# Patient Record
Sex: Female | Born: 1935 | Race: White | Hispanic: No | Smoking: Never smoker
Health system: Southern US, Community
[De-identification: ages and names within clinical notes are randomized; demographics above are authoritative.]

## PROBLEM LIST (undated history)

## (undated) DIAGNOSIS — F32A Depression, unspecified: Secondary | ICD-10-CM

## (undated) DIAGNOSIS — C8590 Non-Hodgkin lymphoma, unspecified, unspecified site: Secondary | ICD-10-CM

## (undated) DIAGNOSIS — Z9889 Other specified postprocedural states: Secondary | ICD-10-CM

## (undated) DIAGNOSIS — F329 Major depressive disorder, single episode, unspecified: Secondary | ICD-10-CM

## (undated) DIAGNOSIS — R112 Nausea with vomiting, unspecified: Secondary | ICD-10-CM

## (undated) DIAGNOSIS — D649 Anemia, unspecified: Secondary | ICD-10-CM

## (undated) DIAGNOSIS — F419 Anxiety disorder, unspecified: Secondary | ICD-10-CM

## (undated) HISTORY — PX: CATARACT EXTRACTION, BILATERAL: SHX1313

---
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
## 1958-11-24 DIAGNOSIS — R112 Nausea with vomiting, unspecified: Secondary | ICD-10-CM

## 1958-11-24 HISTORY — PX: ABDOMINAL HYSTERECTOMY: SHX81

## 1958-11-24 HISTORY — DX: Nausea with vomiting, unspecified: R11.2

## 1960-11-24 HISTORY — PX: BILATERAL OOPHORECTOMY: SHX1221

## 1999-12-26 ENCOUNTER — Encounter: Admission: RE | Admit: 1999-12-26 | Discharge: 1999-12-26 | Payer: Self-pay | Admitting: Geriatric Medicine

## 1999-12-26 ENCOUNTER — Encounter: Payer: Self-pay | Admitting: Geriatric Medicine

## 1999-12-31 ENCOUNTER — Other Ambulatory Visit: Admission: RE | Admit: 1999-12-31 | Discharge: 1999-12-31 | Payer: Self-pay | Admitting: Otolaryngology

## 2000-01-09 ENCOUNTER — Other Ambulatory Visit: Admission: RE | Admit: 2000-01-09 | Discharge: 2000-01-09 | Payer: Self-pay | Admitting: Otolaryngology

## 2000-02-05 ENCOUNTER — Other Ambulatory Visit: Admission: RE | Admit: 2000-02-05 | Discharge: 2000-02-05 | Payer: Self-pay | Admitting: Otolaryngology

## 2000-06-18 ENCOUNTER — Ambulatory Visit (HOSPITAL_COMMUNITY): Admission: RE | Admit: 2000-06-18 | Discharge: 2000-06-18 | Payer: Self-pay | Admitting: Gastroenterology

## 2000-09-11 ENCOUNTER — Encounter: Payer: Self-pay | Admitting: Oncology

## 2000-09-11 ENCOUNTER — Ambulatory Visit (HOSPITAL_COMMUNITY): Admission: RE | Admit: 2000-09-11 | Discharge: 2000-09-11 | Payer: Self-pay | Admitting: Oncology

## 2001-03-19 ENCOUNTER — Encounter: Payer: Self-pay | Admitting: Oncology

## 2001-03-19 ENCOUNTER — Ambulatory Visit (HOSPITAL_COMMUNITY): Admission: RE | Admit: 2001-03-19 | Discharge: 2001-03-19 | Payer: Self-pay | Admitting: Oncology

## 2002-04-20 ENCOUNTER — Encounter: Admission: RE | Admit: 2002-04-20 | Discharge: 2002-04-20 | Payer: Self-pay | Admitting: Geriatric Medicine

## 2002-04-20 ENCOUNTER — Encounter: Payer: Self-pay | Admitting: Geriatric Medicine

## 2002-04-26 ENCOUNTER — Ambulatory Visit (HOSPITAL_COMMUNITY): Admission: RE | Admit: 2002-04-26 | Discharge: 2002-04-26 | Payer: Self-pay | Admitting: Geriatric Medicine

## 2002-04-26 ENCOUNTER — Encounter: Payer: Self-pay | Admitting: Geriatric Medicine

## 2002-10-03 ENCOUNTER — Encounter: Payer: Self-pay | Admitting: Oncology

## 2002-10-03 ENCOUNTER — Ambulatory Visit (HOSPITAL_COMMUNITY): Admission: RE | Admit: 2002-10-03 | Discharge: 2002-10-03 | Payer: Self-pay | Admitting: Oncology

## 2003-04-25 ENCOUNTER — Encounter: Admission: RE | Admit: 2003-04-25 | Discharge: 2003-04-25 | Payer: Self-pay | Admitting: Geriatric Medicine

## 2003-04-25 ENCOUNTER — Encounter: Payer: Self-pay | Admitting: Geriatric Medicine

## 2003-10-02 ENCOUNTER — Ambulatory Visit (HOSPITAL_COMMUNITY): Admission: RE | Admit: 2003-10-02 | Discharge: 2003-10-02 | Payer: Self-pay | Admitting: Oncology

## 2004-04-25 ENCOUNTER — Encounter: Admission: RE | Admit: 2004-04-25 | Discharge: 2004-04-25 | Payer: Self-pay | Admitting: Geriatric Medicine

## 2004-05-15 ENCOUNTER — Encounter: Admission: RE | Admit: 2004-05-15 | Discharge: 2004-05-15 | Payer: Self-pay | Admitting: Internal Medicine

## 2004-08-15 ENCOUNTER — Ambulatory Visit (HOSPITAL_COMMUNITY): Admission: RE | Admit: 2004-08-15 | Discharge: 2004-08-15 | Payer: Self-pay | Admitting: Gastroenterology

## 2005-02-04 ENCOUNTER — Ambulatory Visit: Payer: Self-pay | Admitting: Oncology

## 2005-04-29 ENCOUNTER — Encounter: Admission: RE | Admit: 2005-04-29 | Discharge: 2005-04-29 | Payer: Self-pay | Admitting: Geriatric Medicine

## 2005-05-29 ENCOUNTER — Ambulatory Visit: Payer: Self-pay | Admitting: Oncology

## 2006-05-11 ENCOUNTER — Encounter: Admission: RE | Admit: 2006-05-11 | Discharge: 2006-05-11 | Payer: Self-pay | Admitting: Geriatric Medicine

## 2007-05-20 ENCOUNTER — Encounter: Admission: RE | Admit: 2007-05-20 | Discharge: 2007-05-20 | Payer: Self-pay | Admitting: Geriatric Medicine

## 2007-05-26 ENCOUNTER — Encounter: Admission: RE | Admit: 2007-05-26 | Discharge: 2007-05-26 | Payer: Self-pay | Admitting: Geriatric Medicine

## 2008-09-27 ENCOUNTER — Encounter: Admission: RE | Admit: 2008-09-27 | Discharge: 2008-09-27 | Payer: Self-pay | Admitting: Geriatric Medicine

## 2009-10-11 ENCOUNTER — Encounter: Admission: RE | Admit: 2009-10-11 | Discharge: 2009-10-11 | Payer: Self-pay | Admitting: Geriatric Medicine

## 2010-12-15 ENCOUNTER — Encounter: Payer: Self-pay | Admitting: Geriatric Medicine

## 2011-04-11 NOTE — Op Note (Signed)
[name], [name] NO.:  [id number]   MEDICAL RECORD NO.:  [id number]          PATIENT TYPE:  AMB   LOCATION:  ENDO                         FACILITY:  [facility]   PHYSICIAN:  Beth Ramos, M.D.   DATE OF BIRTH:  1923/03/15   DATE OF PROCEDURE:  08/15/2004  DATE OF DISCHARGE:                                 OPERATIVE REPORT   PROCEDURE:  Esophagogastroduodenoscopy with Savary esophageal dilation.   INDICATIONS FOR PROCEDURE:  [name] is a 77 year old female born  Dec 15, 1922.  [name] underwent a barium esophogram to evaluate  dysphagia.  Her barium esophogram reveals narrowing at the esophagogastric  junction.  A 13 mm barium tablet traversed the entire length of the  esophagus without hang-up.   ENDOSCOPIST:  Beth Ramos, M.D.   PREMEDICATION:  Versed 2.5 mg, Demerol 50 mg.   PROCEDURE:  After obtaining informed consent, Beth Ramos was placed in the  left lateral decubitus position.  I administered intravenous Demerol and  intravenous Versed to achieve conscious sedation for the procedure.  Patient's blood pressure, oxygen saturation, and cardiac rhythm were  monitored throughout the procedure and documented in the medical record.   The Olympus gastroscope was passed through the posterior hypopharynx and  into the proximal esophagus without difficulty.  The hypopharynx, larynx,  and vocal cords appeared normal.   ESOPHAGOSCOPY:  The proximal, mid, and lower segments of the esophageal  mucosa appear normal.  The esophagogastric junction is noted at 30 cm from  the incisor teeth.  There is a benign-appearing peptic stricture at the  esophagogastric junction.  There is no endoscopic evidence for the presence  of erosive esophagitis or Barrett's esophagus.   GASTROSCOPY:  Beth Ramos has as moderate-sized hiatal hernia.  Retroflexed  view of the gastric cardia and fundus was normal.  The diaphragmatic hiatus  was very patulous.  The gastric body,  antrum, and pylorus appeared normal.   DUODENOSCOPY:  The duodenal bulb and descending duodenum appeared normal.   SAVARY ESOPHAGEAL DILATION:  The Savary wire was passed through the  gastroscope and the tip of the guidewire advanced to the distal gastric  antrum.  This confirmed endoscopically without the use of fluoroscopy.  The  16 mm Savary dilator passed without resistance.  Repeat esophagogastroscopy  confirmed satisfactory dilation of the benign peptic stricture at the  esophagogastric junction and no gastric trauma due to the guidewire.   ASSESSMENT:  Probably chronic gastroesophageal reflux associated with a  benign peptic stricture at the esophagogastric junction, dilated with the 16  mm Savary dilator.      MJ/MEDQ  D:  08/15/2004  T:  08/15/2004  Job:  [id number]   cc:   Hal T. Stoneking, M.D.  [address] [place], [place] [address]  Fax: [phone]

## 2011-11-03 ENCOUNTER — Inpatient Hospital Stay (HOSPITAL_COMMUNITY)
Admission: EM | Admit: 2011-11-03 | Discharge: 2011-11-08 | DRG: 812 | Disposition: A | Discharge status: Home / Self Care | Payer: Medicare Other | Attending: Family Medicine | Admitting: Family Medicine

## 2011-11-03 ENCOUNTER — Emergency Department (HOSPITAL_COMMUNITY): Payer: Medicare Other

## 2011-11-03 ENCOUNTER — Encounter: Payer: Self-pay | Admitting: Emergency Medicine

## 2011-11-03 ENCOUNTER — Other Ambulatory Visit: Payer: Self-pay

## 2011-11-03 DIAGNOSIS — K449 Diaphragmatic hernia without obstruction or gangrene: Secondary | ICD-10-CM | POA: Diagnosis present

## 2011-11-03 DIAGNOSIS — R197 Diarrhea, unspecified: Secondary | ICD-10-CM | POA: Diagnosis present

## 2011-11-03 DIAGNOSIS — F3289 Other specified depressive episodes: Secondary | ICD-10-CM | POA: Diagnosis present

## 2011-11-03 DIAGNOSIS — K269 Duodenal ulcer, unspecified as acute or chronic, without hemorrhage or perforation: Secondary | ICD-10-CM | POA: Diagnosis present

## 2011-11-03 DIAGNOSIS — R5381 Other malaise: Secondary | ICD-10-CM | POA: Diagnosis present

## 2011-11-03 DIAGNOSIS — I480 Paroxysmal atrial fibrillation: Secondary | ICD-10-CM

## 2011-11-03 DIAGNOSIS — C8589 Other specified types of non-Hodgkin lymphoma, extranodal and solid organ sites: Secondary | ICD-10-CM | POA: Diagnosis present

## 2011-11-03 DIAGNOSIS — K2970 Gastritis, unspecified, without bleeding: Secondary | ICD-10-CM | POA: Diagnosis present

## 2011-11-03 DIAGNOSIS — D500 Iron deficiency anemia secondary to blood loss (chronic): Principal | ICD-10-CM | POA: Diagnosis present

## 2011-11-03 DIAGNOSIS — E860 Dehydration: Secondary | ICD-10-CM | POA: Diagnosis present

## 2011-11-03 DIAGNOSIS — I471 Supraventricular tachycardia, unspecified: Secondary | ICD-10-CM | POA: Diagnosis not present

## 2011-11-03 DIAGNOSIS — R799 Abnormal finding of blood chemistry, unspecified: Secondary | ICD-10-CM | POA: Diagnosis present

## 2011-11-03 DIAGNOSIS — R55 Syncope and collapse: Secondary | ICD-10-CM

## 2011-11-03 DIAGNOSIS — E876 Hypokalemia: Secondary | ICD-10-CM | POA: Diagnosis present

## 2011-11-03 DIAGNOSIS — F329 Major depressive disorder, single episode, unspecified: Secondary | ICD-10-CM | POA: Diagnosis present

## 2011-11-03 DIAGNOSIS — R000 Tachycardia, unspecified: Secondary | ICD-10-CM

## 2011-11-03 DIAGNOSIS — R531 Weakness: Secondary | ICD-10-CM

## 2011-11-03 DIAGNOSIS — D72829 Elevated white blood cell count, unspecified: Secondary | ICD-10-CM | POA: Diagnosis present

## 2011-11-03 DIAGNOSIS — I959 Hypotension, unspecified: Secondary | ICD-10-CM | POA: Diagnosis present

## 2011-11-03 HISTORY — DX: Nausea with vomiting, unspecified: R11.2

## 2011-11-03 HISTORY — DX: Other specified postprocedural states: Z98.890

## 2011-11-03 HISTORY — DX: Anemia, unspecified: D64.9

## 2011-11-03 HISTORY — DX: Non-Hodgkin lymphoma, unspecified, unspecified site: C85.90

## 2011-11-03 HISTORY — DX: Major depressive disorder, single episode, unspecified: F32.9

## 2011-11-03 HISTORY — DX: Depression, unspecified: F32.A

## 2011-11-03 LAB — CARDIAC PANEL(CRET KIN+CKTOT+MB+TROPI)
CK, MB: 3.7 ng/mL (ref 0.3–4.0)
CK, MB: 3.1 ng/mL (ref 0.3–4.0)
Relative Index: INVALID (ref 0.0–2.5)
Relative Index: INVALID (ref 0.0–2.5)
Total CK: 26 U/L (ref 7–177)
Troponin I: <0.3 ng/mL (ref <0.30)

## 2011-11-03 LAB — URINALYSIS, ROUTINE W REFLEX MICROSCOPIC
Glucose, UA: NEGATIVE mg/dL
Hgb urine dipstick: NEGATIVE
Ketones, ur: NEGATIVE mg/dL
Leukocytes, UA: NEGATIVE
Nitrite: NEGATIVE
Specific Gravity, Urine: 1.024 (ref 1.005–1.030)
Urobilinogen, UA: 0.2 mg/dL (ref 0.0–1.0)
pH: 5 (ref 5.0–8.0)

## 2011-11-03 LAB — CBC
HCT: 25.2 % — ABNORMAL LOW (ref 36.0–46.0)
MCH: 33.3 pg (ref 26.0–34.0)
MCHC: 34.9 g/dL (ref 30.0–36.0)
MCV: 95.5 fL (ref 78.0–100.0)
Platelets: 198 10*3/uL (ref 150–400)
RDW: 12.5 % (ref 11.5–15.5)

## 2011-11-03 LAB — RETICULOCYTES: Retic Ct Pct: 2.7 % (ref 0.4–3.1)

## 2011-11-03 LAB — COMPREHENSIVE METABOLIC PANEL
ALT: 15 U/L (ref 0–35)
AST: 18 U/L (ref 0–37)
Alkaline Phosphatase: 62 U/L (ref 39–117)
BUN: 63 mg/dL — ABNORMAL HIGH (ref 6–23)
Sodium: 140 mEq/L (ref 135–145)
Total Bilirubin: 0.2 mg/dL — ABNORMAL LOW (ref 0.3–1.2)

## 2011-11-03 LAB — PHOSPHORUS: Phosphorus: 3 mg/dL (ref 2.3–4.6)

## 2011-11-03 LAB — APTT: aPTT: 32 seconds (ref 24–37)

## 2011-11-03 LAB — PROTIME-INR
INR: 1.07 (ref 0.00–1.49)
Prothrombin Time: 14.1 seconds (ref 11.6–15.2)

## 2011-11-03 MED ORDER — VITAMIN C 500 MG PO TABS
500.0000 mg | ORAL_TABLET | Freq: Every day | ORAL | Status: DC
  Administered 2011-11-03 – 2011-11-08 (×6): 500 mg via ORAL
  Filled 2011-11-03 (×7): qty 1

## 2011-11-03 MED ORDER — KCL IN DEXTROSE-NACL 10-5-0.45 MEQ/L-%-% IV SOLN
INTRAVENOUS | Status: DC
  Administered 2011-11-03 – 2011-11-05 (×4): via INTRAVENOUS
  Administered 2011-11-06: 1000 mL via INTRAVENOUS
  Filled 2011-11-03 (×9): qty 1000

## 2011-11-03 MED ORDER — DULOXETINE HCL 60 MG PO CPEP
60.0000 mg | ORAL_CAPSULE | Freq: Every day | ORAL | Status: DC
Start: 2011-11-04 — End: 2011-11-08
  Administered 2011-11-04 – 2011-11-08 (×4): 60 mg via ORAL
  Filled 2011-11-03 (×6): qty 1

## 2011-11-03 MED ORDER — ONDANSETRON HCL 4 MG/2ML IJ SOLN: 4.0000 mg | Freq: Three times a day (TID) | INTRAMUSCULAR | Status: AC | PRN

## 2011-11-03 MED ORDER — FERROUS SULFATE 325 (65 FE) MG PO TABS
325.0000 mg | ORAL_TABLET | Freq: Every day | ORAL | Status: DC
  Administered 2011-11-04 – 2011-11-08 (×5): 325 mg via ORAL
  Filled 2011-11-03 (×6): qty 1

## 2011-11-03 MED ORDER — TRAMADOL HCL 50 MG PO TABS
50.0000 mg | ORAL_TABLET | Freq: Two times a day (BID) | ORAL | Status: DC | PRN
  Administered 2011-11-03: 50 mg via ORAL
  Filled 2011-11-03: qty 1

## 2011-11-03 MED ORDER — VITAMIN B-12 1000 MCG PO TABS
1000.0000 ug | ORAL_TABLET | Freq: Every day | ORAL | Status: DC
  Administered 2011-11-03 – 2011-11-08 (×6): 1000 ug via ORAL
  Filled 2011-11-03 (×7): qty 1

## 2011-11-03 MED ORDER — GABAPENTIN 100 MG PO CAPS
100.0000 mg | ORAL_CAPSULE | Freq: Two times a day (BID) | ORAL | Status: DC
  Administered 2011-11-03 – 2011-11-08 (×10): 100 mg via ORAL
  Filled 2011-11-03 (×13): qty 1

## 2011-11-03 MED ORDER — ONDANSETRON HCL 4 MG/2ML IJ SOLN: 4.0000 mg | Freq: Four times a day (QID) | INTRAMUSCULAR | Status: DC | PRN

## 2011-11-03 MED ORDER — ONDANSETRON HCL 4 MG PO TABS: 4.0000 mg | ORAL_TABLET | Freq: Four times a day (QID) | ORAL | Status: DC | PRN

## 2011-11-03 MED ORDER — SODIUM CHLORIDE 0.9 % IV SOLN: Freq: Once | INTRAVENOUS | Status: DC

## 2011-11-03 MED ORDER — ACETAMINOPHEN 650 MG RE SUPP: 650.0000 mg | Freq: Once | RECTAL | Status: DC

## 2011-11-03 MED ORDER — ONDANSETRON HCL 4 MG/2ML IJ SOLN
INTRAMUSCULAR | Status: AC
  Filled 2011-11-03: qty 2

## 2011-11-03 MED ORDER — SODIUM CHLORIDE 0.9 % IV SOLN
999.0000 mL | Freq: Once | INTRAVENOUS | Status: AC
  Administered 2011-11-03: 500 mL via INTRAVENOUS

## 2011-11-03 NOTE — H&P (Signed)
Hospital Admission Note Date: 11/03/2011  PCP: Ginette Otto, MD, MD  Chief Complaint: Generalized weakness, Lightheaded.   History of Present Illness: Very pleasant 77 year old with Past  Medical History significant for  non-Hodgkin lymphoma in remission since 2005, Depression, Anemia Who was brought by EMS after she was found  Very weak lye down on her couch. The morning of admission at around 5 AM, she had some nausea. She went to the bathroom and she had 2 large watery bowel movement. After that she felt weak and fell in the floor. She crawled to the couch. She try to stand up but she was too weak and lightheaded. She has some mild back pain. She denies chest pain, palpitation, or shortness of breath at the time of episode. The day prior to admission she was at church and she had some lightheadness which resolved. Per Daughter Ms Jhaveri has not been eating well for last couple of weeks.  She denies cough, Dysuria, abdominal pain, melena or hemathochezia. No more Diarrhea while in the ED.    Allergies: Sulfa antibiotics Past Medical History  Diagnosis Date  . Depression   . PONV (postoperative nausea and vomiting) 1960  . Anemia   . Lymphoma    Prior to Admission medications   Medication Sig Start Date End Date Taking? Authorizing Provider  DULoxetine (CYMBALTA) 60 MG capsule Take 60 mg by mouth daily.     Yes Historical Provider, MD  ferrous sulfate 325 (65 FE) MG tablet Take 325 mg by mouth daily with breakfast.     Yes Historical Provider, MD  gabapentin (NEURONTIN) 100 MG capsule Take 100 mg by mouth 2 (two) times daily.     Yes Historical Provider, MD  traMADol (ULTRAM) 50 MG tablet Take 50 mg by mouth 2 (two) times daily as needed. Maximum dose= 8 tablets per day pain    Yes Historical Provider, MD  vitamin B-12 (CYANOCOBALAMIN) 1000 MCG tablet Take 1,000 mcg by mouth daily.     Yes Historical Provider, MD  vitamin C (ASCORBIC ACID) 500 MG tablet Take 500 mg by mouth daily.      Yes Historical Provider, MD   Past Surgical History  Procedure Date  . Abdominal hysterectomy 1960    partial  . Bilateral oophorectomy 1962  . Appendectomy 1948  . Cataract extraction, bilateral    History reviewed. No pertinent family history. History   Social History  . Marital Status: Widowed    Spouse Name: N/A    Number of Children: N/A  . Years of Education: N/A   Occupational History  . Not on file.   Social History Main Topics  . Smoking status: Never Smoker   . Smokeless tobacco: Never Used  . Alcohol Use: No  . Drug Use: No  . Sexually Active: No     Review of Systems: Pertinent items are noted in HPI. Physical Exam: Filed Vitals:   11/03/11 1117 11/03/11 1134 11/03/11 1346 11/03/11 1620  BP:  140/55 123/60 123/53  Pulse:  84 108 112  Temp:  97.7 F (36.5 C) 99.2 F (37.3 C)   TempSrc:   Oral   Resp:  20 20 18   SpO2: 99% 100% 99% 97%   No intake or output data in the 24 hours ending 11/03/11 1745 BP 123/53  Pulse 112  Temp(Src) 99.2 F (37.3 C) (Oral)  Resp 18  SpO2 97%  General Appearance:    Alert, cooperative, no distress, appears stated age  Head:  Normocephalic, without obvious abnormality, atraumatic  Eyes:    PERRL, conjunctiva/corneas clear, EOM's intact,     Ears:    Normal TM's and external ear canals, both ears  Nose:   Nares normal, septum midline, mucosa normal, no drainage    or sinus tenderness  Throat:   Lips, mucosa, and tongue dry;   Neck:   Supple, symmetrical, trachea midline, no adenopathy;    thyroid:  no enlargement/tenderness/nodules; no carotid   bruit or JVD     Lungs:     Clear to auscultation bilaterally, respirations unlabored  Chest Wall:    No tenderness or deformity   Heart:    Regular rate and rhythm, S1 and S2 normal, no murmur, rub   or gallop     Abdomen:     Soft, non-tender, bowel sounds active all four quadrants,    no masses, no organomegaly        Extremities:   Extremities normal,  atraumatic, no cyanosis or edema  Pulses:   2+ and symmetric all extremities  Skin:   Skin color, texture, turgor normal, no rashes or lesions     Neurologic:   CNII-XII intact, Patient with general weakness, sensation and reflexe throughout   Lab results:  [facility] 11/03/11 1219  NA 140  K 4.0  CL 104  CO2 25  GLUCOSE 109*  BUN 63*  CREATININE 0.57  CALCIUM 9.4  MG 1.7  PHOS --    Basename 11/03/11 1219  AST 18  ALT 15  ALKPHOS 62  BILITOT 0.2*  PROT 5.6*  ALBUMIN 3.3*   Basename 11/03/11 1219  WBC 17.2*  NEUTROABS --  HGB 8.8*  HCT 25.2*  MCV 95.5  PLT 198    Basename 11/03/11 1219  CKTOTAL 27  CKMB 3.7  CKMBINDEX --  TROPONINI <0.30   Imaging results:  Dg Chest 1 View  11/03/2011  *RADIOLOGY REPORT*  Clinical Data: Fall, weakness  CHEST - 1 VIEW  Comparison: None  Findings: Cardiomediastinal silhouette is unremarkable.  No acute infiltrate or pleural effusion.  No pulmonary edema.  Mild degenerative changes thoracic spine.  IMPRESSION: No active disease.  Mild degenerative changes thoracic spine.  Original Report Authenticated By: Natasha Mead, M.D.   Ct Head Wo Contrast  11/03/2011  *RADIOLOGY REPORT*  Clinical Data: Recent fall.  Vomiting.  Non-Hodgkins lymphoma 2001.  CT HEAD WITHOUT CONTRAST  Technique:  Contiguous axial images were obtained from the base of the skull through the vertex without contrast.  Comparison: PET CT 10/12/2003.  Findings: No skull fracture.  Visualized sinuses, mastoid air cells and middle ear cavities are clear.  No intracranial hemorrhage.  No hydrocephalus.  Small vessel disease type changes. No CT evidence of large acute infarct.  Small acute infarct cannot be excluded by CT.  No intracranial mass lesion detected on this unenhanced exam.  IMPRESSION: No skull fracture or intracranial hemorrhage.  Small vessel disease type changes without CT evidence of large acute infarct.  Original Report Authenticated By: Fuller Canada, M.D.    Other results: EKG: sinus rhythm.   Patient Active Hospital Problem List:  Near syncope (11/03/2011): This could be  secondary to dehydration , secondary to diarrhea in a patient with poor oral intake. Continue  with IV fluids . I will  check orthostatic vitals in the morning. Cardiac enzymes, carotid doppler, monitor on telemetry for arrhythmia. Patient tachycardic appears sinus. I will check EKG.  Consider MRI brain if work up unrevealing.   Leukocytosis (11/03/2011)  Probably stress related. UA negative for infection. Chest x ray negative. With her history of lymphoma I will check peripheral smear, LDH.   Tachycardia (11/03/2011) I will check EKG, continue with IV fluids, TSH.   Generalized Weakness:  Probably related to dehydration. Repeat Hb in am. I will check TSH, B12. She will need PT , OT consult.   Anemia: I will check anemia panel, guaiac stool, type and screen. Bilirubin normal unlikely hemolysis. Concern for hematology malignancy.    Arjan Strohm M.D. [facility] [phone]           11/03/2011, 5:45 PM

## 2011-11-03 NOTE — ED Notes (Signed)
[id number]<YQ> Expected date:11/03/11<BR> Expected time:11:10 AM<BR> Means of arrival:Ambulance<BR> Comments:<BR> EMS 8 GC- 77 y/o female N/V/D for 24 hrs.

## 2011-11-03 NOTE — ED Provider Notes (Signed)
History     CSN: [id number] Arrival date & time: 11/03/2011 11:13 AM   First MD Initiated Contact with Patient 11/03/11 1120      Chief Complaint  Patient presents with  . Emesis    x 1 day  . Fall    dizzy after vomiting  . Diarrhea  . Dizziness    (Consider location/radiation/quality/duration/timing/severity/associated sxs/prior treatment) HPI Patient presents immediately after a near-syncopal episode.  She recalls being in the bathroom, and sub acutely feeling LH.  She fell and had minor trauma to her low back.  She crawled to the couch, and returned to the bathroom for an episode of emesis and diarrhea. Past Medical History  Diagnosis Date  . Depression     Past Surgical History  Procedure Date  . Abdominal hysterectomy     No family history on file.  History  Substance Use Topics  . Smoking status: Never Smoker   . Smokeless tobacco: Not on file  . Alcohol Use: No    OB History    Grav Para Term Preterm Abortions TAB SAB Ect Mult Living                  Review of Systems  Constitutional:       HPI  HENT:       HPI otherwise negative  Eyes: Negative.   Respiratory:       HPI, otherwise negative  Cardiovascular:       HPI, otherwise nmegative  Gastrointestinal: Negative for vomiting.  Genitourinary:       HPI, otherwise negative  Musculoskeletal:       HPI, otherwise negative  Skin: Negative.   Neurological: Negative for syncope.    Allergies  Review of patient's allergies indicates no known allergies.  Home Medications   Current Outpatient Rx  Name Route Sig Dispense Refill  . DULOXETINE HCL 60 MG PO CPEP Oral Take 60 mg by mouth daily.      Marland Kitchen FERROUS SULFATE 325 (65 FE) MG PO TABS Oral Take 325 mg by mouth daily with breakfast.      . GABAPENTIN 100 MG PO CAPS Oral Take 100 mg by mouth 2 (two) times daily.      . TRAMADOL HCL 50 MG PO TABS Oral Take 50 mg by mouth 2 (two) times daily as needed. Maximum dose= 8 tablets per day pain       . VITAMIN B-12 1000 MCG PO TABS Oral Take 1,000 mcg by mouth daily.      Marland Kitchen VITAMIN C 500 MG PO TABS Oral Take 500 mg by mouth daily.        BP 140/55  Pulse 84  Temp 97.7 F (36.5 C)  Resp 20  SpO2 100%  Physical Exam  Constitutional: She is oriented to person, place, and time. She appears well-developed and well-nourished. No distress.  HENT:  Head: Normocephalic and atraumatic.  Eyes: Lids are normal. Pupils are equal, round, and reactive to light.       Pale conjuctiva  Cardiovascular: Normal rate and regular rhythm.   Pulmonary/Chest: Effort normal and breath sounds normal. No stridor.  Abdominal: Soft. She exhibits no distension.  Musculoskeletal: She exhibits no edema and no tenderness.  Neurological: She is alert and oriented to person, place, and time.  Skin: She is not diaphoretic. There is pallor.  Psychiatric: She has a normal mood and affect.    ED Course  Procedures (including critical care time)   Labs Reviewed  CBC  CARDIAC PANEL(CRET KIN+CKTOT+MB+TROPI)  COMPREHENSIVE METABOLIC PANEL  MAGNESIUM  URINALYSIS, ROUTINE W REFLEX MICROSCOPIC   No results found.   No diagnosis found.  Pulse ox 99% RA- normal Cardiac monitor 80's - sr normal   Date: 11/03/2011  Rate: 89  Rhythm: normal sinus rhythm  QRS Axis: normal  Intervals: normal  ST/T Wave abnormalities: nonspecific T wave changes  Conduction Disutrbances:none  Narrative Interpretation:   Old EKG Reviewed: none available  Xr, ct reviewed by me: no acute findings  MDM  This 77 yo female presents following a near syncopal episode and subsequent fall. Notably the patient also complains of nausea, vomiting, diarrhea. It is unclear whether these symptoms preceded or a result of the patient's near-syncopal episode. On exam the patient is in no distress with essentially normal vital signs. Patient's labs are suggestive of ongoing infection though without clear etiology demonstrated on urinalysis or  x-ray. The patient will be admitted for further evaluation and management of this near-syncopal episode        Gerhard Munch, MD 11/03/11 1701

## 2011-11-03 NOTE — ED Notes (Signed)
Pt from home with n,v,d x 1 day. Had fall this am after vomiting without injury noted.

## 2011-11-04 ENCOUNTER — Inpatient Hospital Stay (HOSPITAL_COMMUNITY): Payer: Medicare Other

## 2011-11-04 DIAGNOSIS — R55 Syncope and collapse: Secondary | ICD-10-CM

## 2011-11-04 LAB — TSH: TSH: 1.113 u[IU]/mL (ref 0.350–4.500)

## 2011-11-04 LAB — PATHOLOGIST SMEAR REVIEW

## 2011-11-04 LAB — CARDIAC PANEL(CRET KIN+CKTOT+MB+TROPI)
CK, MB: 2.9 ng/mL (ref 0.3–4.0)
Total CK: 31 U/L (ref 7–177)
Total CK: 42 U/L (ref 7–177)

## 2011-11-04 LAB — FOLATE: Folate: >20 ng/mL

## 2011-11-04 LAB — IRON AND TIBC
Saturation Ratios: 33 % (ref 20–55)
UIBC: 168 ug/dL (ref 125–400)

## 2011-11-04 LAB — FERRITIN: Ferritin: 39 ng/mL (ref 10–291)

## 2011-11-04 NOTE — Progress Notes (Signed)
*  PRELIMINARY RESULTS* Carotid dopplers performed. Preliminary findings showed no ICA stenosis. Antegrade vertebral artery flow bilateral.  Beth Ramos 11/04/2011, 11:25 AM

## 2011-11-04 NOTE — Progress Notes (Signed)
Subjective: Feels already better today. No further diarrhea. No chest pain or shortness of breath.  Objective: Vital signs in last 24 hours: Temp:  [97.7 F (36.5 C)-98.2 F (36.8 C)] 98.2 F (36.8 C) (12/11 1407) Pulse Rate:  [91-111] 110  (12/11 1414) Resp:  [18-24] 18  (12/11 1414) BP: (92-147)/(59-79) 100/63 mmHg (12/11 1414) SpO2:  [94 %-100 %] 98 % (12/11 1414) Weight:  [52 kg (114 lb 10.2 oz)] 114 lb 10.2 oz (52 kg) (12/10 1820) Weight change:  Last BM Date: 11/03/11  Intake/Output from previous day: 12/10 0701 - 12/11 0700 In: 1043.8 [P.O.:240; I.V.:803.8] Out: 1600 [Urine:1600] Total I/O In: 240 [P.O.:240] Out: 1050 [Urine:1050]   Physical Exam: General: Comfortable, alert, communicative, fully oriented, not short of breath at rest.  HEENT:  Moderate clinical pallor, no jaundice, no conjunctival injection or discharge. NECK:  Supple, JVP not seen, no carotid bruits, no palpable lymphadenopathy, no palpable goiter. CHEST:  Clinically clear to auscultation, no wheezes, no crackles. HEART:  Sounds 1 and 2 heard, normal, regular, no murmurs. ABDOMEN:  Full, soft, non-tender, no palpable organomegaly, no palpable masses, normal bowel sounds. GENITALIA:  Not examined. LOWER EXTREMITIES:  No pitting edema, palpable peripheral pulses. MUSCULOSKELETAL SYSTEM:  Generalized osteoarthritic changes, otherwise, normal. CENTRAL NERVOUS SYSTEM:  No focal neurologic deficit on gross examination.  Lab Results:  [facility] 11/03/11 1219  WBC 17.2*  HGB 8.8*  HCT 25.2*  PLT 198    Basename 11/03/11 1219  NA 140  K 4.0  CL 104  CO2 25  GLUCOSE 109*  BUN 63*  CREATININE 0.57  CALCIUM 9.4   Recent Results (from the past 240 hour(s))  PATHOLOGIST SMEAR REVIEW     Status: Normal   Collection Time   11/03/11  7:10 PM      Component Value Range Status Comment   Tech Review Reviewed by Elana Alm. Hillard, MD   Final      Studies/Results: Dg Chest 1 View  11/03/2011   *RADIOLOGY REPORT*  Clinical Data: Fall, weakness  CHEST - 1 VIEW  Comparison: None  Findings: Cardiomediastinal silhouette is unremarkable.  No acute infiltrate or pleural effusion.  No pulmonary edema.  Mild degenerative changes thoracic spine.  IMPRESSION: No active disease.  Mild degenerative changes thoracic spine.  Original Report Authenticated By: Natasha Mead, M.D.   Ct Head Wo Contrast  11/03/2011  *RADIOLOGY REPORT*  Clinical Data: Recent fall.  Vomiting.  Non-Hodgkins lymphoma 2001.  CT HEAD WITHOUT CONTRAST  Technique:  Contiguous axial images were obtained from the base of the skull through the vertex without contrast.  Comparison: PET CT 10/12/2003.  Findings: No skull fracture.  Visualized sinuses, mastoid air cells and middle ear cavities are clear.  No intracranial hemorrhage.  No hydrocephalus.  Small vessel disease type changes. No CT evidence of large acute infarct.  Small acute infarct cannot be excluded by CT.  No intracranial mass lesion detected on this unenhanced exam.  IMPRESSION: No skull fracture or intracranial hemorrhage.  Small vessel disease type changes without CT evidence of large acute infarct.  Original Report Authenticated By: Fuller Canada, M.D.    Medications: Scheduled Meds:   . DULoxetine  60 mg Oral Daily  . ferrous sulfate  325 mg Oral Q breakfast  . gabapentin  100 mg Oral BID  . ondansetron      . vitamin B-12  1,000 mcg Oral Daily  . vitamin C  500 mg Oral Daily   Continuous Infusions:   .  dextrose 5 % and 0.45 % NaCl with KCl 10 mEq/L 75 mL/hr at 11/04/11 0931   PRN Meds:.ondansetron (ZOFRAN) IV, ondansetron (ZOFRAN) IV, ondansetron, traMADol  Assessment/Plan:  Active Problems:  2. Near syncope: This was multifactorial, secondary to dehydration/orthostasis, againsta background of moderate anemia, and a single diarrheal episode. Patient will be managed with adequate rehydration. Fortunately, no further diarrheal episodes have occurred, and  patient is already feeling better. No arrhythmias have been recorded on telemetric monitoring. 2. Dehydration: Per daughter, patient has had poor oral intake lately. BUN/creatinine ratio is markedly elevated, consistent with dehydration. Patient is on no culprit meds.  3. Leukocytosis: Patient has known Lymphoma. Blood smear shows reactive lymphocytosis, as well as toxic granulations. Urinalysis is negative. No obvious foci of infection has been elicited, and patient is apyrexial. We shall complete septic w/u with CXR and blood cultures.  4. Tachycardia: This is secondary to volume depletion, and has already improved.  5. Weakness generalized: Multifactorial.  6. Anemia: Moderate, normocytic, likely of chronic disease. Will follow CBC.    LOS: 1 day   Mieka Leaton,CHRISTOPHER 11/04/2011, 5:09 PM

## 2011-11-05 ENCOUNTER — Encounter (HOSPITAL_COMMUNITY): Payer: Self-pay | Admitting: Gastroenterology

## 2011-11-05 LAB — BASIC METABOLIC PANEL
BUN: 15 mg/dL (ref 6–23)
CO2: 26 mEq/L (ref 19–32)
Chloride: 108 mEq/L (ref 96–112)
GFR calc non Af Amer: 86 mL/min — ABNORMAL LOW (ref >90)
Glucose, Bld: 105 mg/dL — ABNORMAL HIGH (ref 70–99)
Potassium: 3.6 mEq/L (ref 3.5–5.1)

## 2011-11-05 LAB — PREPARE RBC (CROSSMATCH)

## 2011-11-05 LAB — CBC
Hemoglobin: 10.6 g/dL — ABNORMAL LOW (ref 12.0–15.0)
MCH: 32.8 pg (ref 26.0–34.0)
MCHC: 33.7 g/dL (ref 30.0–36.0)
MCHC: 35.2 g/dL (ref 30.0–36.0)
MCV: 97.7 fL (ref 78.0–100.0)
Platelets: 154 10*3/uL (ref 150–400)
Platelets: 176 10*3/uL (ref 150–400)
Platelets: 177 10*3/uL (ref 150–400)
RDW: 13.4 % (ref 11.5–15.5)
RDW: 13.3 % (ref 11.5–15.5)
RDW: 15.4 % (ref 11.5–15.5)
WBC: 9.4 10*3/uL (ref 4.0–10.5)

## 2011-11-05 LAB — URINE CULTURE
Colony Count: NO GROWTH
Culture: NO GROWTH

## 2011-11-05 LAB — PROTIME-INR: INR: 0.97 (ref 0.00–1.49)

## 2011-11-05 MED ORDER — DIPHENHYDRAMINE HCL 25 MG PO CAPS
25.0000 mg | ORAL_CAPSULE | Freq: Once | ORAL | Status: AC
  Administered 2011-11-05: 25 mg via ORAL
  Filled 2011-11-05: qty 1

## 2011-11-05 MED ORDER — SORBITOL 70 % SOLN
20.0000 mL | Freq: Two times a day (BID) | Status: DC
  Administered 2011-11-05 – 2011-11-06 (×3): 20 mL via ORAL
  Filled 2011-11-05 (×5): qty 30

## 2011-11-05 MED ORDER — FUROSEMIDE 10 MG/ML IJ SOLN
20.0000 mg | Freq: Once | INTRAMUSCULAR | Status: DC
  Administered 2011-11-05: 20 mg via INTRAVENOUS
  Filled 2011-11-05: qty 2

## 2011-11-05 MED ORDER — FUROSEMIDE 10 MG/ML IJ SOLN
20.0000 mg | Freq: Once | INTRAMUSCULAR | Status: DC
  Administered 2011-11-05: 20 mg via INTRAVENOUS
  Filled 2011-11-05 (×2): qty 2

## 2011-11-05 NOTE — Progress Notes (Signed)
Occupational Therapy Evaluation Patient Details Name: Beth Ramos MRN: [id number] DOB: [birth year] Today's Date: 11/05/2011 1405-1430 EV2 Problem List:  Patient Active Problem List  Diagnoses  . Near syncope  . Leukocytosis  . Tachycardia  . Weakness generalized    Past Medical History:  Past Medical History  Diagnosis Date  . Depression   . PONV (postoperative nausea and vomiting) 1960  . Anemia   . Lymphoma    Past Surgical History:  Past Surgical History  Procedure Date  . Abdominal hysterectomy 1960    partial  . Bilateral oophorectomy 1962  . Appendectomy 1948  . Cataract extraction, bilateral     OT Assessment/Plan/Recommendation OT Assessment Clinical Impression Statement: Pt demonstrates mod I w/ all ADLs. No need for DME or f/u from an OT standpoint. Pt may benefit from use of AD for ambulation (PT still to assess) OT Recommendation/Assessment: Patient does not need any further OT services OT Goals    OT Evaluation Precautions/Restrictions  Restrictions Weight Bearing Restrictions: No Prior Functioning Home Living Lives With: Alone Type of Home: House Home Layout: One level Home Access: Stairs to enter Entrance Stairs-Rails: Right;Left;Can reach both Entrance Stairs-Number of Steps: 6 Bathroom Shower/Tub: Other (comment);Tub/shower unit (grab bars) Bathroom Toilet:  (vanity beside) Home Adaptive Equipment: Other (comment) (life alert) Prior Function Level of Independence: Independent with basic ADLs;Independent with homemaking with ambulation;Independent with transfers;Independent with gait Driving: Yes Vocation: Part time employment ADL ADL Grooming: Performed;Wash/dry hands;Modified independent Where Assessed - Grooming: Standing at sink Upper Body Bathing: Simulated;Modified independent Where Assessed - Upper Body Bathing: Standing at sink Lower Body Bathing: Simulated;Modified independent Where Assessed - Lower Body Bathing: Sit to stand  from chair Upper Body Dressing: Simulated;Modified independent Where Assessed - Upper Body Dressing: Sitting, chair;Unsupported Lower Body Dressing: Performed;Modified independent Where Assessed - Lower Body Dressing: Sit to stand from chair Toilet Transfer: Performed;Modified independent;Other (comment) (using grab bar) Toilet Transfer Method: Proofreader: Regular height toilet;Grab bars Toileting - Clothing Manipulation: Performed;Independent Where Assessed - Toileting Clothing Manipulation: Sit to stand from 3-in-1 or toilet Toileting - Hygiene: Performed;Independent Where Assessed - Toileting Hygiene: Sit to stand from 3-in-1 or toilet Tub/Shower Transfer: Simulated;Modified independent Tub/Shower Transfer Method: Science writer: Grab bars ADL Comments: Pt does require RBs during some functional tasks & demos impaired dynamic standing balance during ambulation (holding onto furniture) Vision/Perception  Vision - History Baseline Vision: Wears glasses only for reading Patient Visual Report: No change from baseline Vision - Assessment Vision Assessment: Vision not tested [employer] Arousal/Alertness: Awake/alert Overall Cognitive Status: Appears within functional limits for tasks assessed Orientation Level: Oriented X4 Sensation/Coordination Sensation Light Touch: Appears Intact Hot/Cold: Appears Intact Additional Comments: B ankles edemetous Coordination Gross/ Fine Motor Movements are Fluid and Coordinated: Yes Extremity Assessment RUE Assessment RUE Assessment: Within Functional Limits LUE Assessment LUE Assessment: Within Functional Limits Mobility  Bed Mobility Bed Mobility: No Transfers Transfers: Yes Sit to Stand: 6: Modified independent (Device/Increase time);With upper extremity assist;With armrests;From bed;From chair/3-in-1;From toilet Stand to Sit: 6: Modified independent (Device/Increase time);With  upper extremity assist;With armrests;To chair/3-in-1;To toilet;To bed Exercises   End of Session OT - End of Session Activity Tolerance: Patient tolerated treatment well Patient left: in chair;with call bell in reach;with family/visitor present General Behavior During Session: [facility] for tasks performed Cognition: [facility] for tasks performed   Ryle Buscemi [phone] 11/05/2011, 2:39 PM

## 2011-11-05 NOTE — Consult Note (Signed)
Reason for Consult: Symptomatic anemia Referring Physician: [facility]  Beth Ramos is an 77 y.o. female.  HPI: Patient seen at the request of her usual gastroenterologist Dr. Laural Benes and the [facility] hospitalist for symptomatic anemia. Her stools are always black but she's on iron but she has no current GI symptoms and has not been on any aspirin or nonsteroidals but one brother had colon cancer and I think the only test she's had in years has been a flexible sigmoidoscopy from a GI standpoint. She did have ulcers years ago and her stomach bothered her back than. She has been on iron for some time but has no other complaints  Past Medical History  Diagnosis Date  . Depression   . PONV (postoperative nausea and vomiting) 1960  . Anemia   . Lymphoma     Past Surgical History  Procedure Date  . Abdominal hysterectomy 1960    partial  . Bilateral oophorectomy 1962  . Appendectomy 1948  . Cataract extraction, bilateral     History reviewed. No pertinent family history.  Social History:  reports that she has never smoked. She has never used smokeless tobacco. She reports that she does not drink alcohol or use illicit drugs.  Allergies:  Allergies  Allergen Reactions  . Sulfa Antibiotics Nausea And Vomiting    Medications: I have reviewed the patient's current medications.  Results for orders placed during the hospital encounter of 11/03/11 (from the past 48 hour(s))  URINE CULTURE     Status: Normal   Collection Time   11/03/11  6:13 PM      Component Value Range Comment   Specimen Description URINE, RANDOM      Special Requests NONE      Setup Time [id number]      Colony Count NO GROWTH      Culture NO GROWTH      Report Status 11/05/2011 FINAL     CARDIAC PANEL(CRET KIN+CKTOT+MB+TROPI)     Status: Normal   Collection Time   11/03/11  7:10 PM      Component Value Range Comment   Total CK 26  7 - 177 (U/L)    CK, MB 3.1  0.3 - 4.0 (ng/mL)    Troponin I <0.30   <0.30 (ng/mL)    Relative Index RELATIVE INDEX IS INVALID  0.0 - 2.5    PATHOLOGIST SMEAR REVIEW     Status: Normal   Collection Time   11/03/11  7:10 PM      Component Value Range Comment   Tech Review Reviewed by Elana Alm. Hillard, MD     LACTATE DEHYDROGENASE     Status: Normal   Collection Time   11/03/11  7:10 PM      Component Value Range Comment   LD 147  94 - 250 (U/L)   MAGNESIUM     Status: Normal   Collection Time   11/03/11  7:10 PM      Component Value Range Comment   Magnesium 1.7  1.5 - 2.5 (mg/dL)   PHOSPHORUS     Status: Normal   Collection Time   11/03/11  7:10 PM      Component Value Range Comment   Phosphorus 3.0  2.3 - 4.6 (mg/dL)   TSH     Status: Normal   Collection Time   11/03/11  7:10 PM      Component Value Range Comment   TSH 1.113  0.350 - 4.500 (uIU/mL)   APTT  Status: Normal   Collection Time   11/03/11  7:10 PM      Component Value Range Comment   aPTT 32  24 - 37 (seconds)   PROTIME-INR     Status: Normal   Collection Time   11/03/11  7:10 PM      Component Value Range Comment   Prothrombin Time 14.1  11.6 - 15.2 (seconds)    INR 1.07  0.00 - 1.49    RETICULOCYTES     Status: Abnormal   Collection Time   11/03/11  7:10 PM      Component Value Range Comment   Retic Ct Pct 2.7  0.4 - 3.1 (%)    RBC. 2.43 (*) 3.87 - 5.11 (MIL/uL)    Retic Count, Manual 65.6  19.0 - 186.0 (K/uL)   TYPE AND SCREEN     Status: Normal (Preliminary result)   Collection Time   11/03/11  7:10 PM      Component Value Range Comment   ABO/RH(D) A POS      Antibody Screen NEG      Sample Expiration 11/06/2011      Unit Number [id number]      Blood Component Type RED CELLS,LR      Unit division 00      Status of Unit ISSUED      Transfusion Status OK TO TRANSFUSE      Crossmatch Result Compatible      Unit Number [id number]      Blood Component Type RED CELLS,LR      Unit division 00      Status of Unit ALLOCATED      Transfusion Status OK TO TRANSFUSE       Crossmatch Result Compatible     VITAMIN B12     Status: Normal   Collection Time   11/03/11  7:10 PM      Component Value Range Comment   Vitamin B-12 545  211 - 911 (pg/mL)   FOLATE     Status: Normal   Collection Time   11/03/11  7:10 PM      Component Value Range Comment   Folate >20.0     IRON AND TIBC     Status: Normal   Collection Time   11/03/11  7:10 PM      Component Value Range Comment   Iron 84  42 - 135 (ug/dL)    TIBC 284  132 - 440 (ug/dL)    Saturation Ratios 33  20 - 55 (%)    UIBC 168  125 - 400 (ug/dL)   FERRITIN     Status: Normal   Collection Time   11/03/11  7:10 PM      Component Value Range Comment   Ferritin 39  10 - 291 (ng/mL)   ABO/RH     Status: Normal   Collection Time   11/03/11  7:10 PM      Component Value Range Comment   ABO/RH(D) A POS     CARDIAC PANEL(CRET KIN+CKTOT+MB+TROPI)     Status: Normal   Collection Time   11/04/11 12:50 AM      Component Value Range Comment   Total CK 31  7 - 177 (U/L)    CK, MB 2.9  0.3 - 4.0 (ng/mL)    Troponin I <0.30  <0.30 (ng/mL)    Relative Index RELATIVE INDEX IS INVALID  0.0 - 2.5    CARDIAC PANEL(CRET KIN+CKTOT+MB+TROPI)     Status: Normal  Collection Time   11/04/11  9:45 AM      Component Value Range Comment   Total CK 42  7 - 177 (U/L)    CK, MB 3.4  0.3 - 4.0 (ng/mL)    Troponin I <0.30  <0.30 (ng/mL)    Relative Index RELATIVE INDEX IS INVALID  0.0 - 2.5    BASIC METABOLIC PANEL     Status: Abnormal   Collection Time   11/05/11  5:05 AM      Component Value Range Comment   Sodium 140  135 - 145 (mEq/L)    Potassium 3.6  3.5 - 5.1 (mEq/L)    Chloride 108  96 - 112 (mEq/L)    CO2 26  19 - 32 (mEq/L)    Glucose, Bld 105 (*) 70 - 99 (mg/dL)    BUN 15  6 - 23 (mg/dL)    Creatinine, Ser 1.61 (*) 0.50 - 1.10 (mg/dL)    Calcium 8.6  8.4 - 10.5 (mg/dL)    GFR calc non Af Amer 86 (*) >90 (mL/min)    GFR calc Af Amer >90  >90 (mL/min)   CBC     Status: Abnormal   Collection Time    11/05/11  5:05 AM      Component Value Range Comment   WBC 8.6  4.0 - 10.5 (K/uL)    RBC 1.92 (*) 3.87 - 5.11 (MIL/uL)    Hemoglobin 6.3 (*) 12.0 - 15.0 (g/dL)    HCT 09.6 (*) 04.5 - 46.0 (%)    MCV 97.4  78.0 - 100.0 (fL)    MCH 32.8  26.0 - 34.0 (pg)    MCHC 33.7  30.0 - 36.0 (g/dL)    RDW 40.9  81.1 - 91.4 (%)    Platelets 154  150 - 400 (K/uL)   PREPARE RBC (CROSSMATCH)     Status: Normal   Collection Time   11/05/11  9:00 AM      Component Value Range Comment   Order Confirmation ORDER PROCESSED BY BLOOD BANK     CBC     Status: Abnormal   Collection Time   11/05/11  9:04 AM      Component Value Range Comment   WBC 9.4  4.0 - 10.5 (K/uL)    RBC 2.13 (*) 3.87 - 5.11 (MIL/uL)    Hemoglobin 7.1 (*) 12.0 - 15.0 (g/dL)    HCT 78.2 (*) 95.6 - 46.0 (%)    MCV 97.7  78.0 - 100.0 (fL)    MCH 33.3  26.0 - 34.0 (pg)    MCHC 34.1  30.0 - 36.0 (g/dL)    RDW 21.3  08.6 - 57.8 (%)    Platelets 176  150 - 400 (K/uL)   PROTIME-INR     Status: Normal   Collection Time   11/05/11  9:04 AM      Component Value Range Comment   Prothrombin Time 13.1  11.6 - 15.2 (seconds)    INR 0.97  0.00 - 1.49      Dg Chest 2 View  11/04/2011  *RADIOLOGY REPORT*  Clinical Data: Weakness.  CHEST - 2 VIEW  Comparison: 11/03/2011  Findings: The lungs are clear without focal infiltrate, edema, pneumothorax or pleural effusion. The cardiopericardial silhouette is enlarged.  Moderate hiatal hernia noted. Bones are diffusely demineralized.  IMPRESSION: Stable.  No acute cardiopulmonary process.  Original Report Authenticated By: ERIC A. MANSELL, M.D.    ROS negative except above Blood pressure 158/71, pulse  102, temperature 98.1 F (36.7 C), temperature source Oral, resp. rate 20, height 5\' 2"  (1.575 m), weight 52 kg (114 lb 10.2 oz), SpO2 97.00%. Physical Exam no acute distress vital signs stable afebrile exam pertinent for abdomen being soft nontender labs were reviewed office chart  reviewed  Assessment/Plan: Symptomatic anemia despite being on iron Plan: The risks benefits and methods of endoscopy was discussed and we will proceed with that first tomorrow morning with further workup and plans pending those findings  Hazel Wrinkle E 11/05/2011, 1:32 PM

## 2011-11-05 NOTE — Progress Notes (Signed)
PT/OT/ST Cancellation Note  _X__Treatment cancelled today due to medical issues with patient which prohibited therapy. Pt's Hgb is 7.1 and pt is scheduled to receive blood transfusion. Pt c/o not feeling well-"worse today than yesterday." PT will check back later today, if possible, or tomorrow to complete PT evaluation after transfusion-pt in agreement. Thanks.   ___ Treatment cancelled today due to patient receiving procedure or test   ___ Treatment cancelled today due to patient's refusal to participate   ___ Treatment cancelled today due to   Signature:  Carmina Miller, PT [phone]

## 2011-11-05 NOTE — Progress Notes (Signed)
Office record update.  History: [name] is an 77 year old female born 1923-01-05. On 06/18/2011, the patient was seen by me in the office to evaluate unexplained iron deficiency associated with a hemoglobin 10.9 g, ferritin 22.6 ng/mL, and iron saturation 16%. Her brother died of colon cancer at age 24. She underwent a normal flexible proctosigmoidoscopy to 60 cm in 2001. She has never undergone colonoscopic screening of her colon. The patient denied hematuria, hemoptysis, hematochezia, or the passage of melenic-appearing stool. I discussed with the patient the complications associated with esophagogastroduodenoscopy, colonoscopy, and colon polyp removal including intestinal bleeding, intestinal perforation, and oversedation. The patient declined endoscopic evaluation of her iron deficiency anemia.  July 2012 chronic medications: Iron sulfate, calcium, multivitamin, Cymbalta, tramadol, Neurontin.  Past medical and surgical history: Follicular lymphoma. Gastroesophageal reflux associated with an esophageal stricture. Peptic ulcer disease. Degenerative joint disease of the lumbar spine. Labyrinthine dysfunction. Mitral valve regurgitation. Aortic valve sclerosis. Osteoporosis. Hypercholesterolemia. Depression. Chronic back pain. Appendectomy. Total abdominal hysterectomy. Bilateral salpingo-oophorectomy. Lymph node biopsy from the right side of the neck. Bilateral cataract surgery.  Family history: Brother died of colon cancer at age 59.  Habits: The patient is a former cigarette smoker who quit smoking cigarettes in 2002. She does not consume alcohol.  Medication allergies: Sulfa.  Assessment: Chronic iron deficiency anemia associated with a family history of colon cancer.  Recommendations: I will be unable to see the patient in the hospital. If she is not actively bleeding, I would recommend packed red blood cell transfusions followed by hospital discharge and a scheduled followup office  visit with me to discuss endoscopic evaluation of her chronic iron deficiency anemia.

## 2011-11-05 NOTE — Progress Notes (Signed)
[facility]PITAL[facility] progress note    Interval h/o:- 77 yo cf admit 12.10 for weakness and syncope, BUn/CRe signif elevated on admit elevated on admit.     Subjective: Feels less well than yesterday.  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  Does get tired more easily than usual.  Had Hodgkins in 2001, and was Rx by him and she was turned loose by Dr Darnelle Catalan in 2005.-states was on some small amount of chemo, and "took 3 pills a day" (rotuxin) States has had an ulcer in stomach about 20 yrs ago-thinks it was Danise Edge saw her for this. No abd pain, no n, no diarr, does have constipation.   Dizzy this am initially on standing, lasted only a cpl of seconds, no cp/n/v    Objective: Vital signs in last 24 hours: Temp:  [97.8 F (36.6 C)-98.4 F (36.9 C)] 98 F (36.7 C) (12/12 1115) Pulse Rate:  [90-110] 90  (12/12 1115) Resp:  [18-24] 20  (12/12 1115) BP: (100-149)/(62-73) 149/73 mmHg (12/12 1115) SpO2:  [97 %-100 %] 97 % (12/12 1042) Weight change:   Intake/Output Summary (Last 24 hours) at 11/05/11 1140 Last data filed at 11/05/11 0900  Gross per 24 hour  Intake   2205 ml  Output   1950 ml  Net    255 ml    BP 149/73  Pulse 90  Temp(Src) 98 F (36.7 C) (Oral)  Resp 20  Ht 5\' 2"  (1.575 m)  Wt 52 kg (114 lb 10.2 oz)  BMI 20.97 kg/m2  SpO2 97% General appearance: alert, cooperative and appears stated age Head: Normocephalic, without obvious abnormality, atraumatic Throat: lips, mucosa, and tongue normal; teeth and gums normal Lungs: clear to auscultation bilaterally and normal percussion bilaterally Heart: regular rate and rhythm, systolic murmur: late systolic 4/6, crescendo and decrescendo at 2nd left intercostal space, at 2nd right intercostal space and tachycardic to 120 on telemetry, some PAC's Pulses: 2+ and symmetric  Lab Results:  [facility] 11/05/11 0505 11/03/11 1910 11/03/11 1219  NA 140 -- 140  K 3.6 -- 4.0    CL 108 -- 104  CO2 26 -- 25  GLUCOSE 105* -- 109*  BUN 15 -- 63*  CREATININE 0.48* -- 0.57  CALCIUM 8.6 -- 9.4  MG -- 1.7 1.7  PHOS -- 3.0 --    Basename 11/03/11 1219  AST 18  ALT 15  ALKPHOS 62  BILITOT 0.2*  PROT 5.6*  ALBUMIN 3.3*   No results found for this basename: LIPASE:2,AMYLASE:2 in the last 72 hours  Basename 11/05/11 0904 11/05/11 0505  WBC 9.4 8.6  NEUTROABS -- --  HGB 7.1* 6.3*  HCT 20.8* 18.7*  MCV 97.7 97.4  PLT 176 154    Basename 11/04/11 0945 11/04/11 0050 11/03/11 1910  CKTOTAL 42 31 26  CKMB 3.4 2.9 3.1  CKMBINDEX -- -- --  TROPONINI <0.30 <0.30 <0.30     Basename 11/03/11 1910  TSH 1.113  T4TOTAL --  T3FREE --  THYROIDAB --    Basename 11/03/11 1910  VITAMINB12 545  FOLATE >20.0  FERRITIN 39  TIBC 252  IRON 84  RETICCTPCT 2.7   Micro Results: Recent Results (from the past 240 hour(s))  URINE CULTURE     Status: Normal   Collection Time   11/03/11  6:13 PM      Component Value Range Status Comment   Specimen Description URINE, RANDOM   Final    Special  Requests NONE   Final    Setup Time [id number]   Final    Colony Count NO GROWTH   Final    Culture NO GROWTH   Final    Report Status 11/05/2011 FINAL   Final   PATHOLOGIST SMEAR REVIEW     Status: Normal   Collection Time   11/03/11  7:10 PM      Component Value Range Status Comment   Tech Review Reviewed by Elana Alm. Hillard, MD   Final       Medications: I have reviewed the patient's current medications. Scheduled Meds:   . diphenhydrAMINE  25 mg Oral Once  . DULoxetine  60 mg Oral Daily  . ferrous sulfate  325 mg Oral Q breakfast  . furosemide  20 mg Intravenous Once  . furosemide  20 mg Intravenous Once  . gabapentin  100 mg Oral BID  . vitamin B-12  1,000 mcg Oral Daily  . vitamin C  500 mg Oral Daily   Continuous Infusions:   . dextrose 5 % and 0.45 % NaCl with KCl 10 mEq/L 75 mL/hr at 11/04/11 2127   PRN Meds:.ondansetron (ZOFRAN) IV,  ondansetron, traMADol   Assessment/Plan  Patient Active Hospital Problem List: Near syncope (11/03/2011)   Assessment: Likely etiology could be blood loss anemia-Hb this am confirms an acute drop.  Spoke with GI.Dr. Sherryl Barters, who agree likely would benefit from UGI scope.  Denis any goody powders nsaids (but has been on tramadol) etc etc Leukocytosis (11/03/2011)   Assessment: Blood smear shows reactive lymphocytosis, as well as toxic granulations-CXr done 12.11 was neg.  BCX x 2 neg Tachycardia (11/03/2011)   Assessment: Has had PVC's-if no acute changes overnight would d/c tele Anemia-Severe and likely 2/2 to UGIB.  Transfused 12.12--follow trend Vol depletion-likely 2/2 to Upper GIB >>Renal issues-follow trend am  : LOS: 2 days   Deidra Spease,[name] 11/05/2011, 11:40 AM

## 2011-11-05 NOTE — Progress Notes (Addendum)
CRITICAL VALUE ALERT  Critical value received:  Hemoglobin & Hematocrit     Component Value Date/Time   HGB 6.3* 11/05/2011 0505   HCT 18.7* 11/05/2011 0505     Date of notification:11/05/2011  Time of notification: 6:15am  Critical value read back: yes  Nurse who received alert:  Belinda Fisher   MD notified (1st page):  Westley Hummer NP  Time of first page:  6:19am  MD notified (2nd page): Westley Hummer NP  Time of second page: 6:35 AM   Responding MD:  Westley Hummer NP Time MD responded: 6:37 AM

## 2011-11-06 ENCOUNTER — Encounter (HOSPITAL_COMMUNITY)
Admission: EM | Disposition: A | Discharge status: Home / Self Care | Payer: Self-pay | Source: Home / Self Care | Attending: Family Medicine

## 2011-11-06 ENCOUNTER — Encounter (HOSPITAL_COMMUNITY): Payer: Self-pay | Admitting: *Deleted

## 2011-11-06 ENCOUNTER — Other Ambulatory Visit: Payer: Self-pay | Admitting: Gastroenterology

## 2011-11-06 DIAGNOSIS — E876 Hypokalemia: Secondary | ICD-10-CM

## 2011-11-06 DIAGNOSIS — I480 Paroxysmal atrial fibrillation: Secondary | ICD-10-CM

## 2011-11-06 HISTORY — PX: ESOPHAGOGASTRODUODENOSCOPY: SHX5428

## 2011-11-06 LAB — TYPE AND SCREEN
ABO/RH(D): A POS
Antibody Screen: NEGATIVE
Unit division: 0
Unit division: 0

## 2011-11-06 LAB — CARDIAC PANEL(CRET KIN+CKTOT+MB+TROPI)
CK, MB: 4.5 ng/mL — ABNORMAL HIGH (ref 0.3–4.0)
Relative Index: INVALID (ref 0.0–2.5)
Relative Index: INVALID (ref 0.0–2.5)
Relative Index: INVALID (ref 0.0–2.5)
Total CK: 31 U/L (ref 7–177)
Total CK: 33 U/L (ref 7–177)
Troponin I: 0.34 ng/mL (ref <0.30)
Troponin I: 0.44 ng/mL (ref <0.30)
Troponin I: 0.38 ng/mL (ref <0.30)

## 2011-11-06 LAB — BASIC METABOLIC PANEL
CO2: 24 mEq/L (ref 19–32)
Calcium: 8.7 mg/dL (ref 8.4–10.5)
Creatinine, Ser: 0.53 mg/dL (ref 0.50–1.10)

## 2011-11-06 LAB — CBC
MCH: 32.6 pg (ref 26.0–34.0)
MCV: 91.4 fL (ref 78.0–100.0)
Platelets: 168 10*3/uL (ref 150–400)
RDW: 15.8 % — ABNORMAL HIGH (ref 11.5–15.5)
WBC: 12.9 10*3/uL — ABNORMAL HIGH (ref 4.0–10.5)

## 2011-11-06 LAB — PHOSPHORUS: Phosphorus: 3 mg/dL (ref 2.3–4.6)

## 2011-11-06 SURGERY — EGD (ESOPHAGOGASTRODUODENOSCOPY)
Anesthesia: Moderate Sedation

## 2011-11-06 MED ORDER — FENTANYL CITRATE 0.05 MG/ML IJ SOLN
INTRAMUSCULAR | Status: DC | PRN
  Administered 2011-11-06: 25 ug via INTRAVENOUS

## 2011-11-06 MED ORDER — MIDAZOLAM HCL 10 MG/2ML IJ SOLN
INTRAMUSCULAR | Status: AC
  Filled 2011-11-06: qty 2

## 2011-11-06 MED ORDER — DILTIAZEM HCL 100 MG IV SOLR
5.0000 mg/h | INTRAVENOUS | Status: AC
  Administered 2011-11-06 – 2011-11-07 (×3): 5 mg/h via INTRAVENOUS
  Filled 2011-11-06 (×2): qty 100

## 2011-11-06 MED ORDER — POTASSIUM CHLORIDE 10 MEQ/100ML IV SOLN
10.0000 meq | Freq: Once | INTRAVENOUS | Status: AC
  Administered 2011-11-06: 10 meq via INTRAVENOUS
  Filled 2011-11-06: qty 100

## 2011-11-06 MED ORDER — BUTAMBEN-TETRACAINE-BENZOCAINE 2-2-14 % EX AERO
INHALATION_SPRAY | CUTANEOUS | Status: DC | PRN
  Administered 2011-11-06: 2 via TOPICAL

## 2011-11-06 MED ORDER — FENTANYL CITRATE 0.05 MG/ML IJ SOLN
INTRAMUSCULAR | Status: AC
  Filled 2011-11-06: qty 2

## 2011-11-06 MED ORDER — SODIUM CHLORIDE 0.9 % IV SOLN
INTRAVENOUS | Status: AC
  Administered 2011-11-06: 03:00:00 via INTRAVENOUS
  Administered 2011-11-06: 250 mL/h via INTRAVENOUS

## 2011-11-06 MED ORDER — SODIUM CHLORIDE 0.9 % IV BOLUS (SEPSIS)
500.0000 mL | Freq: Once | INTRAVENOUS | Status: AC
  Administered 2011-11-06: 500 mL via INTRAVENOUS

## 2011-11-06 MED ORDER — METOPROLOL TARTRATE 1 MG/ML IV SOLN: 2.5000 mg | Freq: Once | INTRAVENOUS | Status: DC

## 2011-11-06 MED ORDER — ALUM & MAG HYDROXIDE-SIMETH 200-200-20 MG/5ML PO SUSP
30.0000 mL | Freq: Once | ORAL | Status: AC
  Administered 2011-11-06: 30 mL via ORAL

## 2011-11-06 MED ORDER — POTASSIUM CHLORIDE 10 MEQ/100ML IV SOLN
10.0000 meq | INTRAVENOUS | Status: AC
  Administered 2011-11-06 (×2): 10 meq via INTRAVENOUS
  Filled 2011-11-06 (×2): qty 100

## 2011-11-06 MED ORDER — METOPROLOL TARTRATE 1 MG/ML IV SOLN
INTRAVENOUS | Status: AC
  Administered 2011-11-06: 2.5 mg
  Filled 2011-11-06: qty 5

## 2011-11-06 MED ORDER — ACETAMINOPHEN 325 MG PO TABS
ORAL_TABLET | ORAL | Status: AC
  Administered 2011-11-06: 650 mg via ORAL
  Filled 2011-11-06: qty 3

## 2011-11-06 MED ORDER — MIDAZOLAM HCL 10 MG/2ML IJ SOLN
INTRAMUSCULAR | Status: DC | PRN
  Administered 2011-11-06: 1 mg via INTRAVENOUS
  Administered 2011-11-06: 2 mg via INTRAVENOUS

## 2011-11-06 MED ORDER — ACETAMINOPHEN 500 MG PO TABS
1000.0000 mg | ORAL_TABLET | Freq: Once | ORAL | Status: AC
  Administered 2011-11-06: 650 mg via ORAL

## 2011-11-06 MED ORDER — DOCUSATE SODIUM 100 MG PO CAPS
100.0000 mg | ORAL_CAPSULE | Freq: Two times a day (BID) | ORAL | Status: DC
  Administered 2011-11-06 – 2011-11-08 (×4): 100 mg via ORAL
  Filled 2011-11-06 (×5): qty 1

## 2011-11-06 NOTE — Op Note (Signed)
[facility] [address] [place], [place]  [address]  ENDOSCOPY PROCEDURE REPORT  PATIENT:  Beth, Ramos  MR#:  [id number] BIRTHDATE:  Apr 26, 1923, 88 yrs. old  GENDER:  female  ENDOSCOPIST:  Vida Rigger, MD Referred by:  Danise Edge, M.D. Merlene Laughter, M.D.  PROCEDURE DATE:  11/06/2011 PROCEDURE:  EGD with biopsy, [id number] ASA CLASS:  Class III INDICATIONS:  iron deficient guaiac positivity  MEDICATIONS: 3 mg Versed 25 mcg fentanyl TOPICAL ANESTHETIC: Used  DESCRIPTION OF PROCEDURE:   After the risks benefits and alternatives of the procedure were thoroughly explained, informed consent was obtained.  The Pentax Gastroscope [id number] endoscope was introduced through the mouth and advanced to the proximal jejunum, without limitations.  The instrument was slowly withdrawn as the mucosa was fully examined. <<PROCEDUREIMAGES>>  FINDINGS: 1. Moderate hiatal hernia 2. Small antral ulcer status post biopsy 3. Few gastric erosions 4. Atrophic gastritis status post antral and proximal biopsies to rule out Helicobacter 5. No signs of bleeding 6. Otherwise within normal limits to the probable ligament of Treitz  COMPLICATIONS: None  ENDOSCOPIC IMPRESSION: Above  RECOMMENDATIONS: Await pathology long-term pump inhibitors followup with her primary gastroenterologist Dr. Laural Benes to decide any further workup and plans like possible CT scan, virtual colonoscopy or optical colonoscopy but no further GI workup at this time and will advance diet  REPEAT EXAM:  As needed pending biopsies  ______________________________ Vida Rigger, MD  CC:  Danise Edge MDHal Stoneking, MD  n. Rosalie DoctorVida Rigger at 11/06/2011 12:50 PM  Jessie Foot, [phone]

## 2011-11-06 NOTE — Progress Notes (Signed)
[facility]ITAL[facility] progress note    Interval h/o:- 77 yo cf admit 12.10 for weakness and syncope, BUn/CRe signif elevated on admit elevated on admit. Found to have a low HB on labs 12.12.12, with significant PMH of no Colon CA screening-only Flex Sig 2001 to 60 cm, and recently seen by Dr. Josefa Half and declined Esophagogastroscopy-seeems to have had Barium swallow done 2005=sliding hiatal hernia Had Hodgkins in 2001,was on some small amount of chemo, and "took 3 pills a day" (rotuxin) Magrinat cleared her in 2005) Patient went into rapid A. Fib. 12.12 and was hypotensive and was transferred to SDU on Cardizem Gtt-converted to NSR 04:15 this am    Subjective: Events noted.  Pt well.  No CpN/SOB/BV/DV. Hungry States she has seen Dr. Mendel Ryder in the past for "heart issues", but cannot be sure what-no records in E-chart to substantiate this either.  Treatment Team:  Petra Kuba, MD Objective: Vital signs in last 24 hours: Temp:  [97.8 F (36.6 C)-98.2 F (36.8 C)] 98 F (36.7 C) (12/13 0400) Pulse Rate:  [81-165] 81  (12/13 0623) Resp:  [11-24] 17  (12/13 0623) BP: (66-160)/(34-90) 92/40 mmHg (12/13 0623) SpO2:  [96 %-100 %] 100 % (12/13 0623) Weight:  [52.4 kg (115 lb 8.3 oz)] 115 lb 8.3 oz (52.4 kg) (12/13 0400) Weight change:   Intake/Output Summary (Last 24 hours) at 11/06/11 0733 Last data filed at 11/06/11 7846  Gross per 24 hour  Intake   3674 ml  Output   2575 ml  Net   1099 ml    BP 92/40  Pulse 81  Temp(Src) 98 F (36.7 C) (Oral)  Resp 17  Ht 5\' 1"  (1.549 m)  Wt 52.4 kg (115 lb 8.3 oz)  BMI 21.83 kg/m2  SpO2 100% General appearance: alert, cooperative and appears stated age  Head: Normocephalic, without obvious abnormality, atraumatic  Throat: lips, mucosa, and tongue normal; teeth and gums normal  Lungs: clear to auscultation bilaterally and normal percussion bilaterally  Heart: regular rate and rhythm, systolic murmur: late systolic 4/6,  crescendo and decrescendo at 2nd left intercostal space, at 2nd right intercostal space and tachycardic90's Pulses: 2+ and symmetric   Lab Results:  Basename 11/06/11 0130 11/05/11 0505 11/03/11 1910 11/03/11 1219  NA 139 140 -- --  K 2.8* 3.6 -- --  CL 103 108 -- --  CO2 24 26 -- --  GLUCOSE 106* 105* -- --  BUN 10 15 -- --  CREATININE 0.53 0.48* -- --  CALCIUM 8.7 8.6 -- --  MG -- -- 1.7 1.7  PHOS -- -- 3.0 --    Basename 11/03/11 1219  AST 18  ALT 15  ALKPHOS 62  BILITOT 0.2*  PROT 5.6*  ALBUMIN 3.3*   No results found for this basename: LIPASE:2,AMYLASE:2 in the last 72 hours  Basename 11/06/11 0130 11/05/11 2053  WBC 12.9* 15.1*  NEUTROABS -- --  HGB 10.2* 10.6*  HCT 28.6* 30.1*  MCV 91.4 91.8  PLT 168 177    Basename 11/06/11 0130 11/04/11 0945 11/04/11 0050  CKTOTAL 28 42 31  CKMB 2.5 3.4 2.9  CKMBINDEX -- -- --  TROPONINI <0.30 <0.30 <0.30     Basename 11/03/11 1910  TSH 1.113  T4TOTAL --  T3FREE --  THYROIDAB --    Basename 11/03/11 1910  VITAMINB12 545  FOLATE >20.0  FERRITIN 39  TIBC 252  IRON 84  RETICCTPCT 2.7   Micro Results: Recent Results (from the past  240 hour(s))  URINE CULTURE     Status: Normal   Collection Time   11/03/11  6:13 PM      Component Value Range Status Comment   Specimen Description URINE, RANDOM   Final    Special Requests NONE   Final    Setup Time [id number]   Final    Colony Count NO GROWTH   Final    Culture NO GROWTH   Final    Report Status 11/05/2011 FINAL   Final   PATHOLOGIST SMEAR REVIEW     Status: Normal   Collection Time   11/03/11  7:10 PM      Component Value Range Status Comment   Tech Review Reviewed by Elana Alm. Hillard, MD   Final   MRSA PCR SCREENING     Status: Normal   Collection Time   11/06/11  3:06 AM      Component Value Range Status Comment   MRSA by PCR NEGATIVE  NEGATIVE  Final     Medications: I have reviewed the patient's current medications. Scheduled Meds:   .  acetaminophen  1,000 mg Oral Once  . alum & mag hydroxide-simeth  30 mL Oral Once  . diphenhydrAMINE  25 mg Oral Once  . DULoxetine  60 mg Oral Daily  . ferrous sulfate  325 mg Oral Q breakfast  . gabapentin  100 mg Oral BID  . metoprolol      . metoprolol  2.5 mg Intravenous Once  . sodium chloride  500 mL Intravenous Once  . sorbitol  20 mL Oral BID  . vitamin B-12  1,000 mcg Oral Daily  . vitamin C  500 mg Oral Daily  . DISCONTD: furosemide  20 mg Intravenous Once  . DISCONTD: furosemide  20 mg Intravenous Once   Continuous Infusions:   . sodium chloride 250 mL/hr (11/06/11 0705)  . dextrose 5 % and 0.45 % NaCl with KCl 10 mEq/L 75 mL/hr at 11/05/11 1917  . diltiazem (CARDIZEM) infusion 5 mg/hr (11/06/11 0445)   PRN Meds:.ondansetron, DISCONTD: ondansetron (ZOFRAN) IV, DISCONTD: traMADol   Assessment/Plan: Patient Active Hospital Problem List: Near syncope (11/03/2011) Assessment: Likely etiology could be blood loss anemia-Hb this am confirms an acute drop. For upper endoscopy today, NPO for procedure  Leukocytosis (11/03/2011) Assessment: Blood smear shows reactive lymphocytosis, as well as toxic granulations-CXr done 12.11 was neg. BCX x 2 neg,. UC also neg from 12.10  Anemia-Severe and likely 2/2 to UGIB. Transfused 12.12--follow trend-apporp response with Hb in 10 range. Await endocopsy report  Paroxysmal a-fib (11/06/2011)   Assessment:Multiple possible etiologies-currently maintaining NSR-is NPO fr procedure-will titrate off to Cardizem PO once returns from Endo Will add Mag and Phos to labs-Replace lytes.  Cold be driven by fluid shifts and anemia-Would hold systemic anticoagulationl Endoscopy performed CHADS2=2.  Defer to GI when seen regarding if needs anticoagulation, but await 24-48 hours and will consult cardiology if Rapid Afib again.  Hypokalemia (11/06/2011)   Assessment: Replace IV today-labs am   LOS: 3 days   Tarren Sabree,[name] 11/06/2011, 7:33 AM

## 2011-11-06 NOTE — Progress Notes (Signed)
Physical Therapy Evaluation Patient Details Name: Beth Ramos MRN: [id number] DOB: [birth year] Today's Date: 11/06/2011 Time: 4098-1191  Eval II Problem List:  Patient Active Problem List  Diagnoses  . Near syncope  . Leukocytosis  . Tachycardia  . Weakness generalized  . Paroxysmal a-fib  . Hypokalemia    Past Medical History:  Past Medical History  Diagnosis Date  . Depression   . PONV (postoperative nausea and vomiting) 1960  . Anemia   . Lymphoma    Past Surgical History:  Past Surgical History  Procedure Date  . Abdominal hysterectomy 1960    partial  . Bilateral oophorectomy 1962  . Appendectomy 1948  . Cataract extraction, bilateral     PT Assessment/Plan/Recommendation PT Assessment Clinical Impression Statement: Pt presents with diagnosis of near syncope. Pt will benefit from skilled PT in the acute care setting to maximize independence in preperation for D/C home with daughter temporarily. Will assess need for assistive device-rollator vs cane, if needed at all. PT Recommendation/Assessment: Patient will need skilled PT in the acute care venue PT Problem List: Decreased strength;Decreased balance;Decreased knowledge of use of DME PT Therapy Diagnosis : Difficulty walking;Generalized weakness PT Plan PT Frequency: Min 3X/week PT Treatment/Interventions: Gait training;Stair training;Functional mobility training;Therapeutic activities;Patient/family education;DME instruction PT Recommendation Follow Up Recommendations: Home health PT Equipment Recommended:  (to be determined) PT Goals  Acute Rehab PT Goals PT Goal Formulation: With patient Pt will go Sit to Supine/Side: with modified independence Pt will go Sit to Stand: with modified independence Pt will go Stand to Sit: with modified independence Pt will Ambulate: >150 feet;with least restrictive assistive device;with supervision Pt will Go Up / Down Stairs: 3-5 stairs;with rail(s);with supervision (3  steps at daughter's home)  PT Evaluation Precautions/Restrictions  Precautions Precautions: Fall Prior Functioning  Home Living Lives With: Alone Type of Home: House Home Layout: One level Home Access: Stairs to enter Entrance Stairs-Rails: Right;Left;Can reach both Entrance Stairs-Number of Steps: 6 Bathroom Shower/Tub: Engineer, manufacturing systems: Standard Home Adaptive Equipment: Other (comment) (life alert) Additional Comments: Pt planning to D/C home with daughter Dois Davenport) for first few days Prior Function Level of Independence: Independent with basic ADLs;Independent with homemaking with ambulation;Independent with transfers;Independent with gait Driving: Yes Vocation: Part time employment Cognition Cognition Arousal/Alertness: Awake/alert Overall Cognitive Status: Appears within functional limits for tasks assessed Orientation Level: Oriented X4 Sensation/Coordination Coordination Gross Motor Movements are Fluid and Coordinated: Yes Extremity Assessment RLE Assessment RLE Assessment: Exceptions to [facility] RLE Strength RLE Overall Strength Comments: Strength > or = 3+/5 LLE Assessment LLE Assessment: Exceptions to [facility] LLE Strength LLE Overall Strength Comments: Strength > or = 3+/5 Mobility (including Balance) Bed Mobility Bed Mobility: Yes Supine to Sit: 6: Modified independent (Device/Increase time) Sit to Supine - Left: 5: Supervision Sit to Supine - Left Details (indicate cue type and reason): Some difficulty getting LEs onto bed. Increased effort.  Transfers Transfers: Yes Sit to Stand: 5: Supervision Stand to Sit: 5: Supervision Ambulation/Gait Ambulation/Gait: Yes Ambulation/Gait Assistance Details (indicate cue type and reason): Min-guard assist. Pt slightly unsteady. Pt c/o dizziness after ambulation during initail balance testing.  Ambulation Distance (Feet): 100 Feet Assistive device: None Gait Pattern: Decreased step length - left;Decreased step  length - right;Decreased stride length  Posture/Postural Control Posture/Postural Control: No significant limitations Balance Balance Assessed: Yes Static Standing Balance Static Standing - Comment/# of Minutes: Attempted balance testing. Pt with difficulty achieving narrow BOS-pt c/o dizziness and needed to sit down. Unable to continue balance testing Exercise  End of Session PT - End of Session Equipment Utilized During Treatment: Gait belt Activity Tolerance: Patient limited by fatigue (Limited by dizziness) Patient left: in bed;with call bell in reach;with family/visitor present General Behavior During Session: [facility] for tasks performed Cognition: [facility] for tasks performed  Rebeca Alert [facility] 11/06/2011, 4:18 PM [phone]

## 2011-11-06 NOTE — Progress Notes (Signed)
CRITICAL VALUE ALERT  Critical value received:  Troponin 0.34  Date of notification:  11/06/2011  Time of notification:  10:13  Critical value read back:yes YES  Nurse who received alert:  Caesar Chestnut, RN  MD notified (1st page):  Pleas Koch, MD  Time of first page:  10:15, with return phone call for verification  MD notified (2nd page): NA  Time of second page: NA  Responding MD:  NA  Time MD responded:  NA

## 2011-11-06 NOTE — Progress Notes (Addendum)
Patient became nauseated, dizzy and c/o SOB when being assisted to bathroom. Pt assisted back to bed initial BP 99/62, HR 130's and in new onset a-fib. Pt also complaining of right rib pain/gas pain. Pt placed on 2l [place]. BP up to 117/70 after several minutes at rest, HR remaining in 130-140's. Rapid Response RN, Elnita Maxwell, notified and came to bedside. Gwinda Passe, NP also notified and new orders received.  Notified NSMT, that patient is now in a-fib.

## 2011-11-06 NOTE — Consults (Addendum)
Admit date: 11/03/2011 Referring Physician: Pleas Koch, MD Primary Physician  Orson Gear M.D. Primary Cardiologist  Verdis Prime MD Reason for Consultation:  elevated cardiac markers  ASSESSMENT: 1. Elevated cardiac markers/NSTEMI type II related to demand ischemia from tachycardia and severe anemia.  2. Paroxysmal atrial fibrillation, self terminating  3 depression  4. Lymphoma   5. Significant anemia with documented GI bleeding this admission  PLAN:  1. No specific cardiac evaluation is indicated at this time. I would not even perform an echocardiogram.  2. Symptomatic control of atrial fibrillation if it recurs. For the time being, no specific therapy is indicated.  3. With anemia and probable GI bleeding, I would not recommend antiplatelet therapy or antithrombotic therapy for either atrial fibrillation or ACS until cleared by GI.  4. Repeat electrocardiogram in the a.m.   HPI: I am asked to see the patient by Dr. Mahala Menghini because of elevated cardiac markers. Patient has no specific cardiac complaints. Had an episode of self terminating atrial fibrillation over the night. This was on top of significant anemia. There is no history of ischemic heart disease. The patient's electrocardiograms do not reveal evidence of ongoing/active ischemia.   PMH:   Past Medical History  Diagnosis Date  . Depression   . PONV (postoperative nausea and vomiting) 1960  . Anemia   . Lymphoma      PSH:   Past Surgical History  Procedure Date  . Abdominal hysterectomy 1960    partial  . Bilateral oophorectomy 1962  . Appendectomy 1948  . Cataract extraction, bilateral     Allergies:  Sulfa antibiotics Prior to Admit Meds:   Prescriptions prior to admission  Medication Sig Dispense Refill  . DULoxetine (CYMBALTA) 60 MG capsule Take 60 mg by mouth daily.        . ferrous sulfate 325 (65 FE) MG tablet Take 325 mg by mouth daily with breakfast.        . gabapentin (NEURONTIN) 100  MG capsule Take 100 mg by mouth 2 (two) times daily.        . traMADol (ULTRAM) 50 MG tablet Take 50 mg by mouth 2 (two) times daily as needed. Maximum dose= 8 tablets per day pain       . vitamin B-12 (CYANOCOBALAMIN) 1000 MCG tablet Take 1,000 mcg by mouth daily.        . vitamin C (ASCORBIC ACID) 500 MG tablet Take 500 mg by mouth daily.         Fam HX:   History reviewed. No pertinent family history. Social HX:    History   Social History  . Marital Status: Widowed    Spouse Name: N/A    Number of Children: N/A  . Years of Education: N/A   Occupational History  . Not on file.   Social History Main Topics  . Smoking status: Never Smoker   . Smokeless tobacco: Never Used  . Alcohol Use: No  . Drug Use: No  . Sexually Active: No   Other Topics Concern  . Not on file   Social History Narrative  . No narrative on file     Review of Systems: No specific CV review of systems other than as noted above.  Physical Exam: Blood pressure 136/58, pulse 87, temperature 97.7 F (36.5 C), temperature source Oral, resp. rate 17, height 5\' 1"  (1.549 m), weight 52.4 kg (115 lb 8.3 oz), SpO2 96.00%. Weight change:    Pale-appearing. No distress. No JVD.  Bilateral carotid bruits transmitted from the aorta. 2+ carotid upstroke. No parvus and tardus. Lungs clear cardiac exam grade 2-3 of 6 crescendo decrescendo murmur right upper sternal border left upper sternal border left mid sternal border. No diastolic murmur. Abdomen soft. Extremities no edema.  Labs:   Lab Results  Component Value Date   WBC 12.9* 11/06/2011   HGB 10.2* 11/06/2011   HCT 28.6* 11/06/2011   MCV 91.4 11/06/2011   PLT 168 11/06/2011    Lab 11/06/11 0130 11/03/11 1219  NA 139 --  K 2.8* --  CL 103 --  CO2 24 --  BUN 10 --  CREATININE 0.53 --  CALCIUM 8.7 --  PROT -- 5.6*  BILITOT -- 0.2*  ALKPHOS -- 62  ALT -- 15  AST -- 18  GLUCOSE 106* --   No results found for this basename: PTT   Lab Results    Component Value Date   INR 0.97 11/05/2011   INR 1.07 11/03/2011   Lab Results  Component Value Date   CKTOTAL 33 11/06/2011   CKMB 4.5* 11/06/2011   TROPONINI 0.44* 11/06/2011       Radiology:  Dg Chest 2 View  11/04/2011  *RADIOLOGY REPORT*  Clinical Data: Weakness.  CHEST - 2 VIEW  Comparison: 11/03/2011  Findings: The lungs are clear without focal infiltrate, edema, pneumothorax or pleural effusion. The cardiopericardial silhouette is enlarged.  Moderate hiatal hernia noted. Bones are diffusely demineralized.  IMPRESSION: Stable.  No acute cardiopulmonary process.  Original Report Authenticated By: ERIC A. MANSELL, M.D.   EKG:  No acute ST-T wave changes no old EKGs from 12/10 and 12/11.    Lesleigh Noe 11/06/2011 4:58 PM

## 2011-11-06 NOTE — Progress Notes (Signed)
[facility] follow-up progress note (same day) Patient back from Endo To grad diet per GI Noted CE's elevated-unclear if this has been from the Non-sustained Tachyarrhythmia earlier which has since flipped back to NSR Pt identifies Dr. Katrinka Blazing as a cardiologist she has seen in the past She is completely CP free and ambulated earlier today but got slightly dizzy at the end of it.  Appreciate consult in advance

## 2011-11-07 ENCOUNTER — Encounter (HOSPITAL_COMMUNITY): Payer: Self-pay

## 2011-11-07 ENCOUNTER — Encounter (HOSPITAL_COMMUNITY): Payer: Self-pay | Admitting: Gastroenterology

## 2011-11-07 LAB — BASIC METABOLIC PANEL
Chloride: 107 mEq/L (ref 96–112)
GFR calc Af Amer: >90 mL/min (ref >90)
GFR calc non Af Amer: 88 mL/min — ABNORMAL LOW (ref >90)
Potassium: 3.2 mEq/L — ABNORMAL LOW (ref 3.5–5.1)
Sodium: 140 mEq/L (ref 135–145)

## 2011-11-07 LAB — CARDIAC PANEL(CRET KIN+CKTOT+MB+TROPI)
CK, MB: 3.2 ng/mL (ref 0.3–4.0)
Relative Index: INVALID (ref 0.0–2.5)
Relative Index: INVALID (ref 0.0–2.5)
Total CK: 25 U/L (ref 7–177)
Total CK: 22 U/L (ref 7–177)
Troponin I: <0.3 ng/mL (ref <0.30)
Troponin I: <0.3 ng/mL (ref <0.30)

## 2011-11-07 LAB — CBC
Hemoglobin: 9.4 g/dL — ABNORMAL LOW (ref 12.0–15.0)
RBC: 2.91 MIL/uL — ABNORMAL LOW (ref 3.87–5.11)

## 2011-11-07 MED ORDER — POTASSIUM CHLORIDE CRYS ER 20 MEQ PO TBCR
40.0000 meq | EXTENDED_RELEASE_TABLET | Freq: Two times a day (BID) | ORAL | Status: DC
  Administered 2011-11-07 – 2011-11-08 (×2): 40 meq via ORAL
  Filled 2011-11-07 (×3): qty 2

## 2011-11-07 MED ORDER — PANTOPRAZOLE SODIUM 40 MG PO TBEC
40.0000 mg | DELAYED_RELEASE_TABLET | Freq: Two times a day (BID) | ORAL | Status: DC
  Administered 2011-11-07 – 2011-11-08 (×3): 40 mg via ORAL
  Filled 2011-11-07 (×4): qty 1

## 2011-11-07 MED ORDER — DILTIAZEM HCL 30 MG PO TABS
30.0000 mg | ORAL_TABLET | Freq: Three times a day (TID) | ORAL | Status: DC
  Administered 2011-11-07 – 2011-11-08 (×3): 30 mg via ORAL
  Filled 2011-11-07 (×6): qty 1

## 2011-11-07 MED ORDER — POTASSIUM CHLORIDE 20 MEQ PO PACK
40.0000 meq | PACK | Freq: Two times a day (BID) | ORAL | Status: DC
  Filled 2011-11-07 (×3): qty 2

## 2011-11-07 NOTE — Progress Notes (Signed)
Patient Name: HERO MCCATHERN Date of Encounter: 11/07/2011    SUBJECTIVE: Asymptomatic. She has not had episodes similar to the atrial fibrillation that occurred 2 nights ago at home.  TELEMETRY:  Normal sinus rhythm without recurrence of atrial fibrillation.: Filed Vitals:   11/07/11 0158 11/07/11 0641 11/07/11 0643 11/07/11 0644  BP: 151/71 109/68 101/62 108/65  Pulse: 80 91 95 99  Temp: 98.7 F (37.1 C) 97.8 F (36.6 C)    TempSrc: Oral Oral    Resp: 16 16    Height:      Weight: 53.9 kg (118 lb 13.3 oz)     SpO2: 96% 97%      Intake/Output Summary (Last 24 hours) at 11/07/11 0742 Last data filed at 11/07/11 0645  Gross per 24 hour  Intake 3177.92 ml  Output   1500 ml  Net 1677.92 ml    LABS: Basic Metabolic Panel:  Basename 11/07/11 0450 11/06/11 0800 11/06/11 0130  NA 140 -- 139  K 3.2* -- 2.8*  CL 107 -- 103  CO2 29 -- 24  GLUCOSE 107* -- 106*  BUN 6 -- 10  CREATININE 0.46* -- 0.53  CALCIUM 9.0 -- 8.7  MG -- 1.5 --  PHOS -- 3.0 --   CBC:  Basename 11/07/11 0450 11/06/11 0130  WBC 11.2* 12.9*  NEUTROABS -- --  HGB 9.4* 10.2*  HCT 27.5* 28.6*  MCV 94.5 91.4  PLT 158 168   Cardiac Enzymes:  Basename 11/07/11 0450 11/06/11 2230 11/06/11 1815  CKTOTAL 25 32 33  CKMB 3.3 4.1* 4.4*  CKMBINDEX -- -- --  TROPONINI <0.30 <0.30 0.38*    Radiology/Studies:  Nothing new  Physical Exam: Blood pressure 108/65, pulse 99, temperature 97.8 F (36.6 C), temperature source Oral, resp. rate 16, height 5\' 1"  (1.549 m), weight 53.9 kg (118 lb 13.3 oz), SpO2 97.00%. Weight change: 1.5 kg (3 lb 4.9 oz)   2 of 6 systolic murmur right upper and left lower sternal border. Unchanged.  ASSESSMENT:  1. Paroxysmal, self terminating atrial fibrillation, probably stress induced.  2. Significant anemia  3. Probable aortic stenosis  4. Hypokalemia  5. Type II non-ST elevation MI secondary to hypotension, tachycardia, and anemia.   Plan:  1. No ischemic  evaluation is indicated in this patient who in the absence of atrial fibrillation has no ischemic symptoms.  2. Clinical aortic stenosis, asymptomatic, at her age needs no specific workup.  3. Replete potassium  4. Please call if we can be of further assistance.  Selinda Eon 11/07/2011, 7:42 AM

## 2011-11-07 NOTE — Progress Notes (Signed)
[facility] HOSPITALIST progress note    Interval h/o:- 77 yo cf admit 12.10 for weakness and syncope, BUn/CRe signif elevated on admit elevated on admit. Found to have a low HB on labs 12.12.12, with significant PMH of no Colon CA screening-only Flex Sig 2001 to 60 cm, and recently seen by Dr. Josefa Half and declined Esophagogastroscopy-seeems to have had Barium swallow done 2005=sliding hiatal hernia  Had Hodgkins in 2001,was on some small amount of chemo, and "took 3 pills a day" (rotuxin) Magrinat cleared her in 2005)  Patient went into rapid A. Fib. 12.12 and was hypotensive and was transferred to SDU on Cardizem Gtt-converted to NSR 04:15 this am Dr. Katrinka Blazing Cardiology consulted and no work-up planned, as likely PSVT from Anemia Hemoglobin has stabilized over the past 24 hours, with outpatient work-up planned   Subjective: Feels good.   Just had a dark stool.   Feels some rumbling in her stomach and feels like she might need to use RR again soon. No cp/n/v/sob/blurred vision/double vision ambulated to RR without difficulty/dizzyness  Treatment Team:  Petra Kuba, MD [facility] III Objective: Vital signs in last 24 hours: Temp:  [97.6 F (36.4 C)-98.7 F (37.1 C)] 97.8 F (36.6 C) (12/14 1300) Pulse Rate:  [71-100] 100  (12/14 1300) Resp:  [16-23] 16  (12/14 1300) BP: (101-151)/(50-71) 145/65 mmHg (12/14 1300) SpO2:  [79 %-99 %] 96 % (12/14 1300) Weight:  [53.9 kg (118 lb 13.3 oz)] 118 lb 13.3 oz (53.9 kg) (12/14 0158) Weight change: 1.5 kg (3 lb 4.9 oz)  Intake/Output Summary (Last 24 hours) at 11/07/11 1605 Last data filed at 11/07/11 1300  Gross per 24 hour  Intake   1925 ml  Output   1700 ml  Net    225 ml    BP 145/65  Pulse 100  Temp(Src) 97.8 F (36.6 C) (Oral)  Resp 16  Ht 5\' 1"  (1.549 m)  Wt 53.9 kg (118 lb 13.3 oz)  BMI 22.45 kg/m2  SpO2 96%  General appearance: alert, cooperative and appears stated age  Head: Normocephalic, without obvious  abnormality, atraumatic  Throat: lips, mucosa, and tongue normal; teeth and gums normal  Lungs: clear to auscultation bilaterally and normal percussion bilaterally  Heart: regular rate and rhythm, systolic murmur: late systolic 4/6, crescendo and decrescendo at 2nd left intercostal space, at 2nd right intercostal space.  Tele NSR Pulses: 2+ and symmetric  Lab Results:                    Basename 11/07/11 0450 11/06/11 0130  WBC 11.2* 12.9*  NEUTROABS -- --  HGB 9.4* 10.2*  HCT 27.5* 28.6*  MCV 94.5 91.4  PLT 158 168     Pertinent labs and imaging all reviewed today   Medications: I have reviewed the patient's current medications. Scheduled Meds:   . docusate sodium  100 mg Oral BID  . DULoxetine  60 mg Oral Daily  . ferrous sulfate  325 mg Oral Q breakfast  . gabapentin  100 mg Oral BID  . metoprolol  2.5 mg Intravenous Once  . pantoprazole  40 mg Oral BID AC  . potassium chloride  10 mEq Intravenous Once  . vitamin B-12  1,000 mcg Oral Daily  . vitamin C  500 mg Oral Daily  . DISCONTD: sorbitol  20 mL Oral BID   Continuous Infusions:   . dextrose 5 % and 0.45 % NaCl with KCl 10 mEq/L 75 mL/hr at 11/07/11  0100  . diltiazem (CARDIZEM) infusion 5 mg/hr (11/07/11 1406)   PRN Meds:.ondansetron   Assessment/Plan: Patient Active Hospital Problem List:  Near syncope (11/03/2011) Assessment: Likely etiology could be blood loss anemia-Upper Endoscopy showed gastritis-still had some blood per stool-CBC am and reassess--If within Hb 9-10 range can likely d/c home  Leukocytosis (11/03/2011) Assessment: Blood smear shows reactive lymphocytosis, as well as toxic granulations-CXr done 12.11 was neg. BCX x 2 neg,. UC also neg from 12.10   Anemia 2/2 to UGIB-Severe and likely 2/2 to UGIB. Transfused 12.12---apporp response to transfusion with Hb in 10 range. Colonsocopy pending per outpt physicians--see #1  Paroxysmal a-fib (11/06/2011) Assessment: Resolved--Multiple possible  etiologies-currently maintaining NSR-is NPO fr procedure-will titrate off to Cardizem PO once returns from Endo  Will add Mag and Phos to labs-Replace lytes. Cold be driven by fluid shifts and anemia-Would hold systemic anticoagulation Appreciate Dr. Smith;'s input-no work-up planned-med management CHADS2=2.  Not a candidate for systemic anticoagulation Cardiac enzymes trended downwards overnight  Elevated Cardiac markers-Driven likely by Afib or symptomatic Anemia-see above discussion  Hypokalemia (11/06/2011) Assessment: Replace IV today-labs am    LOS: 4 days   [name],[name] 11/07/2011, 4:05 PM

## 2011-11-07 NOTE — Progress Notes (Signed)
UR CHART REVIEWED; B Konner Saiz RN, BSN, MHA 

## 2011-11-07 NOTE — Progress Notes (Signed)
Beth Ramos 12:48 PM  Subjective: The patient is asymptomatic and did not have any problems from her endoscopy and is eating regular food and wants to go home  Objective: Vital signs stable afebrile no acute distress abdomen is soft nontender hemoglobin stable  Assessment: Iron deficiency small hiatal hernia and duodenal ulcer  Plan: No further GI workup at this time she will see her primary care doctor on Tuesday and have discussed her case with her primary gastroenterologist Dr. Laural Benes and would proceed with either a CT scan and/or colonoscopy next. I've discussed all the above with the patient and her daughter who agree and I will call me on Tuesday for biopsy results. Please call us sooner if any question or problem  Chesley Veasey E

## 2011-11-08 LAB — BASIC METABOLIC PANEL
BUN: 7 mg/dL (ref 6–23)
CO2: 29 mEq/L (ref 19–32)
Calcium: 9.4 mg/dL (ref 8.4–10.5)
Creatinine, Ser: 0.54 mg/dL (ref 0.50–1.10)
Glucose, Bld: 95 mg/dL (ref 70–99)

## 2011-11-08 LAB — CBC
MCH: 32.2 pg (ref 26.0–34.0)
MCV: 95.7 fL (ref 78.0–100.0)
Platelets: 221 10*3/uL (ref 150–400)
RBC: 3.04 MIL/uL — ABNORMAL LOW (ref 3.87–5.11)
RDW: 15.1 % (ref 11.5–15.5)

## 2011-11-08 MED ORDER — DILTIAZEM HCL ER COATED BEADS 240 MG PO TB24: 240.0000 mg | ORAL_TABLET | Freq: Every day | ORAL | Status: DC

## 2011-11-08 MED ORDER — PANTOPRAZOLE SODIUM 40 MG PO TBEC: 40.0000 mg | DELAYED_RELEASE_TABLET | Freq: Two times a day (BID) | ORAL | Status: DC

## 2011-11-08 NOTE — Progress Notes (Signed)
Patient BP 116/60 HR 89. Notified Tom Callahan-NP and patient has cardizem 30mg  to give and he stated it is okay to give it.

## 2011-11-08 NOTE — Discharge Summary (Signed)
Physician Discharge Summary  Patient ID: MEKAYLA SOMAN MRN: [id number] DOB/AGE: 75/10/1923 75 y.o.  Admit date: 11/03/2011 Discharge date: 11/08/2011  Admission Diagnoses: weakness and malaise  Discharge Diagnoses:  Active Problems:  Near syncope  Leukocytosis  Tachycardia  Weakness generalized  Paroxysmal a-fib  Hypokalemia   Discharged Condition: good  Hospital Course: 77 yo cf admit 12.10 for weakness and syncope, thought initally to have been secondary to possible sepsis-Blood cultures done 12/11 still P but no prelim growth, UC neg and CXR on both 12.10 and 12.11 was neg  Bun/CRe signif elevated on admit elevated on admit. Found to have a low HB on labs 12.12.12, with significant PMH of no Colon CA screening-only Flex Sig 2001 to 60 cm, and recently seen by Dr. Josefa Half and declined the same  Esophagogastroscopy-seeems to have had Barium swallow done 2005=sliding hiatal hernia Had upper endoscopy this admission by Dr. Ewing Schlein which showed essentially just gastritis Further work-up deferred till out-patient follow-up with her regular GE, Dr. Daphine Deutscher Hemoglobin has stabilized over the past 24 hours, with outpatient work-up planned  Had Hodgkins in 2001,was on some small amount of chemo, and "took 3 pills a day" (rotuxin) Magrinat cleared her in 2005)   Patient went into rapid A. Fib. 12.12 and was hypotensive and was transferred to SDU on Cardizem Gtt-converted to NSR 04:15 this 12.14 --Dr. Katrinka Blazing Cardiology consulted and no work-up planned, as likely PSVT from Anemia   Consults: cardiology and GI  Significant Diagnostic Studies: labs: leukocytosis initially and d/c's empiric abx when no source localized, microbiology: blood culture: negative (FINAL RESULT PENDING) and urine culture: negative, radiology: CT scan: no acute changes and endoscopy: gastroscopy: Gastritis - PATH REPORT PENDING  Treatments: IV hydration, cardiac meds: diltiazem and therapies: PT and OT  Discharge  Exam: Blood pressure 120/70, pulse 102, temperature 98.2 F (36.8 C), temperature source Oral, resp. rate 18, height 5\' 1"  (1.549 m), weight 53.9 kg (118 lb 13.3 oz), SpO2 96.00%. General appearance: alert, cooperative and appears stated age Throat: lips, mucosa, and tongue normal; teeth and gums normal Neck: no adenopathy, no carotid bruit, no JVD, supple, symmetrical, trachea midline and thyroid not enlarged, symmetric, no tenderness/mass/nodules Resp: clear to auscultation bilaterally and normal percussion bilaterally Cardio: regular rate and rhythm, S1, S2 normal, no murmur, click, rub or gallop and Tele=NSR with slight tachy low 90's Extremities: extremities normal, atraumatic, no cyanosis or edema Pulses: 2+ and symmetric Neurologic: Grossly normal  Disposition: HOme with daguhter-to use walker and grad activity slowly  Discharge Orders    Future Orders Please Complete By Expires   Diet - low sodium heart healthy      Increase activity slowly      Discharge instructions      Comments:   Please follow with Hal Stoneking per reg appt   Call MD for:  temperature >100.4      Call MD for:  severe uncontrolled pain      Call MD for:  redness, tenderness, or signs of infection (pain, swelling, redness, odor or green/yellow discharge around incision site)      Call MD for:  hives      Call MD for:  extreme fatigue      Call MD for:  persistant dizziness or light-headedness        Current Discharge Medication List    START taking these medications   Details  diltiazem (CARDIZEM LA) 240 MG 24 hr tablet Take 1 tablet (240 mg total) by mouth  daily. Qty: 30 tablet, Refills: 0      CONTINUE these medications which have NOT CHANGED   Details  DULoxetine (CYMBALTA) 60 MG capsule Take 60 mg by mouth daily.      ferrous sulfate 325 (65 FE) MG tablet Take 325 mg by mouth daily with breakfast.      gabapentin (NEURONTIN) 100 MG capsule Take 100 mg by mouth 2 (two) times daily.        vitamin B-12 (CYANOCOBALAMIN) 1000 MCG tablet Take 1,000 mcg by mouth daily.      vitamin C (ASCORBIC ACID) 500 MG tablet Take 500 mg by mouth daily.        STOP taking these medications     traMADol (ULTRAM) 50 MG tablet        Follow-up Information    Follow up with Ginette Otto, MD on 11/11/2011. (at 2:15pm-you will need some lab work, and will need to take your medciines with you)       Follow up with Charolett Bumpers, MD. Make an appointment in 2 weeks. (he will speak to you about possible colonoscopy)    Contact information:   [address], [employer], [place]. [place] [address] [phone]       Follow up with Veatrice Kells W in 3 weeks. (follow-up with him to determine of you need the new medicines, and for general cardiac work-up)    Contact information:   [address] [employer], [place]. [facility] [place] [address] [phone]         To do -get cmet and cbc within 1 week -Diltiazem use needs to be discussed with Cardiologist-NO systemic anticoag, as was Paroxysmal Afib, but had gastritis and low blood count, precluding use of coumadin -PPI to be weaned at the earliest-per Gi, Dr. Daphine Deutscher    I have fully updated family as to the course of their family members care and have answered all questions that they had     Signed: Myli Pae,[name] 11/08/2011, 1:36 PM

## 2011-11-11 LAB — CULTURE, BLOOD (ROUTINE X 2)
Culture  Setup Time: 201212120137
Culture: NO GROWTH

## 2012-07-05 ENCOUNTER — Emergency Department (HOSPITAL_COMMUNITY): Payer: Medicare Other

## 2012-07-05 ENCOUNTER — Encounter (HOSPITAL_COMMUNITY): Payer: Self-pay | Admitting: Neurology

## 2012-07-05 ENCOUNTER — Emergency Department (HOSPITAL_COMMUNITY)
Admission: EM | Admit: 2012-07-05 | Discharge: 2012-07-05 | Disposition: A | Discharge status: Home / Self Care | Payer: Medicare Other | Attending: Emergency Medicine | Admitting: Emergency Medicine

## 2012-07-05 DIAGNOSIS — R269 Unspecified abnormalities of gait and mobility: Secondary | ICD-10-CM | POA: Insufficient documentation

## 2012-07-05 DIAGNOSIS — M545 Low back pain, unspecified: Secondary | ICD-10-CM | POA: Insufficient documentation

## 2012-07-05 DIAGNOSIS — M549 Dorsalgia, unspecified: Secondary | ICD-10-CM | POA: Insufficient documentation

## 2012-07-05 HISTORY — DX: Anxiety disorder, unspecified: F41.9

## 2012-07-05 MED ORDER — HYDROCODONE-ACETAMINOPHEN 5-325 MG PO TABS
2.0000 | ORAL_TABLET | Freq: Once | ORAL | Status: AC
  Administered 2012-07-05: 2 via ORAL
  Filled 2012-07-05: qty 2

## 2012-07-05 MED ORDER — HYDROCODONE-ACETAMINOPHEN 5-325 MG PO TABS: 1.0000 | ORAL_TABLET | ORAL | Status: DC | PRN

## 2012-07-05 NOTE — ED Provider Notes (Signed)
Tripped and fell 4 weeks ago. Presents complaining of low nonradiating back pain pain is worse with walking. She is able walk with her pain. She has been treated with tramadol with relief. On exam alert Glasgow Coma Score 15 lungs clear auscultation heart regular rate and rhythm abdomen nontender pelvis stable and nontender 6 spine mild paralumbar tenderness pain at lumbar air when she sits up from a supine position bilateral lower extremities without deformity swelling or tenderness no pain on internal rotation of either thigh  Doug Sou, MD 07/05/12 1130

## 2012-07-05 NOTE — ED Provider Notes (Signed)
1:13 PM Assumed care of patient in the CDU from Arthor Captain, PA-C and Dr. Ethelda Chick.  Patient presenting today with lower back pain.  The plan is for patient to be reassessed and ambulate after pain medications.  Patient reports that her pain has improved. Patient able to ambulate.  Patient alert and orientated x 3, Heart RRR, Lungs CTAB, Mild lumbar tenderness to palpation, Full ROM of all extremities.  Pascal Lux [place], PA-C 07/05/12 [phone]

## 2012-07-05 NOTE — ED Provider Notes (Signed)
Medical screening examination/treatment/procedure(s) were conducted as a shared visit with non-physician practitioner(s) and myself.  I personally evaluated the patient during the encounter  Doug Sou, MD 07/05/12 1605

## 2012-07-05 NOTE — ED Notes (Signed)
Pt returned from xray

## 2012-07-05 NOTE — ED Notes (Signed)
Pt taken to xray 

## 2012-07-05 NOTE — ED Notes (Signed)
Abigail, PA at the bedside.  

## 2012-07-05 NOTE — ED Notes (Signed)
RN called into room by pt daughter stating pt couldn't breathe. Pt lying on stretcher tearful stating she was unable to breathe and "I think I am going to die". Pt O2 sats 100% on 2L O2 via [place]. Pt appears anxious

## 2012-07-05 NOTE — ED Notes (Signed)
Per ems- pt comes from home c/o back pain. Pt fell 4 weeks ago, as result of that fall had 2 broken ribs and was told she had a cyst on her lumbar spine. Today c/o back pain which has gotten worse over past few days and today she wasn't able to take the pain anymore. Pt rates pain 8/10.  No neuro deficits noted. BP 128/62, HR 96, RR 20. A x 4. Pt using quad cane since last week due to pain. Pt speaking in clear concise sentences.

## 2012-07-05 NOTE — ED Provider Notes (Signed)
Medical screening examination/treatment/procedure(s) were conducted as a shared visit with non-physician practitioner(s) and myself.  I personally evaluated the patient during the encounter  Doug Sou, MD 07/05/12 1702

## 2012-07-05 NOTE — ED Notes (Signed)
Dr. Jacubowitz at the bedside 

## 2012-07-05 NOTE — ED Notes (Signed)
Pt able to dress self independently

## 2012-07-05 NOTE — ED Notes (Signed)
Report given to Annette, RN in CDU 

## 2012-07-05 NOTE — ED Notes (Signed)
PATIENT HAS ARRIVED IN CDU VIA WHEELCHAIR. SHE IS ABLE TO WALK WITH MINIMAL ASSISTANCE. STATES SOME SORENESS BUT GAIT IS STEADY

## 2012-07-05 NOTE — ED Provider Notes (Signed)
History     CSN: [id number]  Arrival date & time 07/05/12  0865   First MD Initiated Contact with Patient 07/05/12 [phone]      Chief Complaint  Patient presents with  . Back Pain    (Consider location/radiation/quality/duration/timing/severity/associated sxs/prior treatment) HPI Comments: Beth Ramos 78 y.o. female   The chief complaint is: Patient presents with:   Back Pain   Patient fell 4 weeks ago and fractured ribs. She presents today with worsening back pain over the last 2 days. Right LOW back and SI joint. RIght hip pain. States that she had decreased ability to ambulate and is having to use her cane to get to the bathroom. She denies wekaness, numbness, urinary/ bowel incontinence, saddle anesthesia, fevers, chills, fatigue, night sweats, unexplained weight loss. She has a history or intravertebral cyst which is treated by Dr. Alvester Morin by spinal injections, last injention 1 year ago.  She takes tramadol for pain.  Denies sxs of sciatica.   Patient is a 78 y.o. female presenting with back pain. The history is provided by the patient and a relative. No language interpreter was used.  Back Pain  Pertinent negatives include no chest pain, no fever, no abdominal pain and no dysuria.    Past Medical History  Diagnosis Date  . Depression   . PONV (postoperative nausea and vomiting) 1960  . Anemia   . Lymphoma   . Anxiety     Past Surgical History  Procedure Date  . Abdominal hysterectomy 1960    partial  . Bilateral oophorectomy 1962  . Appendectomy 1948  . Cataract extraction, bilateral   . Esophagogastroduodenoscopy 11/06/2011    Procedure: ESOPHAGOGASTRODUODENOSCOPY (EGD);  Surgeon: Petra Kuba, MD;  Location: [facility];  Service: Endoscopy;  Laterality: N/A;    No family history on file.  History  Substance Use Topics  . Smoking status: Never Smoker   . Smokeless tobacco: Never Used  . Alcohol Use: No    OB History    Grav Para Term Preterm  Abortions TAB SAB Ect Mult Living                  Review of Systems  Constitutional: Negative for fever, chills, fatigue and unexpected weight change.  HENT: Negative for neck pain.   Respiratory: Negative for cough, chest tightness and wheezing.   Cardiovascular: Negative for chest pain and palpitations.  Gastrointestinal: Negative for nausea, vomiting, abdominal pain, diarrhea, constipation and blood in stool.  Genitourinary: Negative for dysuria, hematuria and flank pain.  Musculoskeletal: Positive for back pain and gait problem. Negative for myalgias and joint swelling.  Neurological: Negative for dizziness and light-headedness.  All other systems reviewed and are negative.    Allergies  Sulfa antibiotics  Home Medications   Current Outpatient Rx  Name Route Sig Dispense Refill  . DILTIAZEM HCL ER COATED BEADS 240 MG PO TB24 Oral Take 240 mg by mouth daily.    . DULOXETINE HCL 60 MG PO CPEP Oral Take 60 mg by mouth daily.      Marland Kitchen FERROUS SULFATE 325 (65 FE) MG PO TABS Oral Take 325 mg by mouth daily with breakfast.      . GABAPENTIN 100 MG PO CAPS Oral Take 100 mg by mouth 2 (two) times daily.      Marland Kitchen SALONPAS EX PADS Apply externally Apply 1 each topically daily as needed. Back pain    . MELOXICAM 7.5 MG PO TABS Oral Take 7.5 mg by mouth  daily.    . TRAMADOL HCL 50 MG PO TABS Oral Take 50 mg by mouth every 4 (four) hours as needed. For pain    . VITAMIN B-12 1000 MCG PO TABS Oral Take 1,000 mcg by mouth daily.      Marland Kitchen VITAMIN C 500 MG PO TABS Oral Take 500 mg by mouth daily.      Marland Kitchen HYDROCODONE-ACETAMINOPHEN 5-325 MG PO TABS Oral Take 1 tablet by mouth every 4 (four) hours as needed for pain. 15 tablet 0    BP 141/62  Pulse 88  Temp 97.8 F (36.6 C) (Oral)  Resp 20  SpO2 100%  Physical Exam  Nursing note and vitals reviewed. Constitutional: She is oriented to person, place, and time. She appears well-developed and well-nourished. No distress.  HENT:  Head:  Normocephalic and atraumatic.  Eyes: Conjunctivae are normal. No scleral icterus.  Neck: Normal range of motion. Neck supple.  Cardiovascular: Normal rate, regular rhythm and normal heart sounds.   Pulmonary/Chest: Effort normal and breath sounds normal. No respiratory distress. She has no wheezes.  Abdominal: Soft. Bowel sounds are normal. She exhibits no distension. There is no tenderness. There is no guarding.  Musculoskeletal: She exhibits tenderness (TTP to right SI joint and gluteal origins along the right right sacral border. ).  Neurological: She is alert and oriented to person, place, and time.       No weakness in LEs  Skin: Skin is warm. She is not diaphoretic.  Psychiatric: She has a normal mood and affect. Her behavior is normal.    ED Course  Procedures (including critical care time)  Labs Reviewed - No data to display Dg Lumbar Spine Complete  07/05/2012  *RADIOLOGY REPORT*  Clinical Data: Chronic low back pain  LUMBAR SPINE - COMPLETE 4+ VIEW  Comparison: 01/20/2011 and 10/25/2009  Findings: Five views of the lumbar spine submitted.  No acute fracture or subluxation.  There is mild dextroscoliosis of the lumbar spine.  Again noted about 2.5 mm anterolisthesis L4 on L5 vertebral body.  There is again noted disc space flattening at L4- L5 and L5 S1 level.  Stable mild disc space flattening at L3-L4 level.  IMPRESSION: No acute fracture or subluxation.  Stable degenerative changes as described above.  Mild dextroscoliosis.  Original Report Authenticated By: Natasha Mead, M.D.   Patient pain unrelieved by home tramadol. I am giving her Norco.  Xrays are unremarkable for pathologic fracture or acute change.  She will go to CDU and if able to ambulate I will D/C home with F/U.  1:07 pm Patient care assumed by Pascal Lux Wingen PA,C Paln is to control pain, and ambulate patient. If able we will d/c home for OP follow up.  1. Back pain       MDM  Patient care assumed by CDU 1:07  PM       Arthor Captain, PA-C 07/05/12 1549

## 2012-07-11 ENCOUNTER — Encounter (HOSPITAL_COMMUNITY): Payer: Self-pay | Admitting: Nurse Practitioner

## 2012-07-11 ENCOUNTER — Emergency Department (HOSPITAL_COMMUNITY): Payer: Medicare Other

## 2012-07-11 ENCOUNTER — Observation Stay (HOSPITAL_COMMUNITY)
Admission: EM | Admit: 2012-07-11 | Discharge: 2012-07-14 | Disposition: A | Discharge status: Skilled Nursing Facility | Payer: Medicare Other | Attending: Internal Medicine | Admitting: Internal Medicine

## 2012-07-11 DIAGNOSIS — F3289 Other specified depressive episodes: Secondary | ICD-10-CM | POA: Insufficient documentation

## 2012-07-11 DIAGNOSIS — S32509A Unspecified fracture of unspecified pubis, initial encounter for closed fracture: Principal | ICD-10-CM | POA: Insufficient documentation

## 2012-07-11 DIAGNOSIS — C8589 Other specified types of non-Hodgkin lymphoma, extranodal and solid organ sites: Secondary | ICD-10-CM | POA: Insufficient documentation

## 2012-07-11 DIAGNOSIS — IMO0001 Reserved for inherently not codable concepts without codable children: Secondary | ICD-10-CM | POA: Insufficient documentation

## 2012-07-11 DIAGNOSIS — R000 Tachycardia, unspecified: Secondary | ICD-10-CM

## 2012-07-11 DIAGNOSIS — R55 Syncope and collapse: Secondary | ICD-10-CM

## 2012-07-11 DIAGNOSIS — E876 Hypokalemia: Secondary | ICD-10-CM

## 2012-07-11 DIAGNOSIS — R531 Weakness: Secondary | ICD-10-CM

## 2012-07-11 DIAGNOSIS — I4891 Unspecified atrial fibrillation: Secondary | ICD-10-CM

## 2012-07-11 DIAGNOSIS — R262 Difficulty in walking, not elsewhere classified: Secondary | ICD-10-CM | POA: Insufficient documentation

## 2012-07-11 DIAGNOSIS — D72829 Elevated white blood cell count, unspecified: Secondary | ICD-10-CM | POA: Insufficient documentation

## 2012-07-11 DIAGNOSIS — W19XXXA Unspecified fall, initial encounter: Secondary | ICD-10-CM | POA: Insufficient documentation

## 2012-07-11 DIAGNOSIS — I480 Paroxysmal atrial fibrillation: Secondary | ICD-10-CM

## 2012-07-11 DIAGNOSIS — S329XXA Fracture of unspecified parts of lumbosacral spine and pelvis, initial encounter for closed fracture: Secondary | ICD-10-CM

## 2012-07-11 DIAGNOSIS — F329 Major depressive disorder, single episode, unspecified: Secondary | ICD-10-CM | POA: Insufficient documentation

## 2012-07-11 DIAGNOSIS — S3210XA Unspecified fracture of sacrum, initial encounter for closed fracture: Secondary | ICD-10-CM | POA: Insufficient documentation

## 2012-07-11 DIAGNOSIS — Z8659 Personal history of other mental and behavioral disorders: Secondary | ICD-10-CM

## 2012-07-11 DIAGNOSIS — M549 Dorsalgia, unspecified: Secondary | ICD-10-CM | POA: Insufficient documentation

## 2012-07-11 DIAGNOSIS — R269 Unspecified abnormalities of gait and mobility: Secondary | ICD-10-CM | POA: Insufficient documentation

## 2012-07-11 DIAGNOSIS — M25559 Pain in unspecified hip: Secondary | ICD-10-CM | POA: Insufficient documentation

## 2012-07-11 LAB — CBC WITH DIFFERENTIAL/PLATELET
Basophils Relative: 1 % (ref 0–1)
Eosinophils Relative: 1 % (ref 0–5)
HCT: 38.3 % (ref 36.0–46.0)
Hemoglobin: 13 g/dL (ref 12.0–15.0)
Lymphocytes Relative: 21 % (ref 12–46)
MCH: 32.6 pg (ref 26.0–34.0)
Neutro Abs: 9.8 10*3/uL — ABNORMAL HIGH (ref 1.7–7.7)
Neutrophils Relative %: 67 % (ref 43–77)
RBC: 3.99 MIL/uL (ref 3.87–5.11)

## 2012-07-11 LAB — COMPREHENSIVE METABOLIC PANEL
ALT: 28 U/L (ref 0–35)
Alkaline Phosphatase: 303 U/L — ABNORMAL HIGH (ref 39–117)
BUN: 19 mg/dL (ref 6–23)
CO2: 26 mEq/L (ref 19–32)
GFR calc Af Amer: >90 mL/min (ref >90)
GFR calc non Af Amer: 83 mL/min — ABNORMAL LOW (ref >90)
Glucose, Bld: 129 mg/dL — ABNORMAL HIGH (ref 70–99)
Potassium: 3.8 mEq/L (ref 3.5–5.1)
Sodium: 134 mEq/L — ABNORMAL LOW (ref 135–145)
Total Bilirubin: 0.4 mg/dL (ref 0.3–1.2)

## 2012-07-11 MED ORDER — DULOXETINE HCL 60 MG PO CPEP
60.0000 mg | ORAL_CAPSULE | Freq: Every day | ORAL | Status: DC
  Administered 2012-07-12 – 2012-07-14 (×3): 60 mg via ORAL
  Filled 2012-07-11 (×3): qty 1

## 2012-07-11 MED ORDER — HYDROMORPHONE HCL PF 1 MG/ML IJ SOLN: 0.5000 mg | INTRAMUSCULAR | Status: DC | PRN

## 2012-07-11 MED ORDER — HYDROCODONE-ACETAMINOPHEN 5-325 MG PO TABS
2.0000 | ORAL_TABLET | Freq: Once | ORAL | Status: AC
  Administered 2012-07-11: 2 via ORAL
  Filled 2012-07-11: qty 2

## 2012-07-11 MED ORDER — OXYCODONE HCL 5 MG PO TABS
5.0000 mg | ORAL_TABLET | Freq: Four times a day (QID) | ORAL | Status: DC | PRN
  Administered 2012-07-11 – 2012-07-14 (×4): 5 mg via ORAL
  Filled 2012-07-11 (×4): qty 1

## 2012-07-11 MED ORDER — ONDANSETRON HCL 4 MG PO TABS: 4.0000 mg | ORAL_TABLET | Freq: Four times a day (QID) | ORAL | Status: DC | PRN

## 2012-07-11 MED ORDER — SODIUM CHLORIDE 0.9 % IJ SOLN
3.0000 mL | Freq: Two times a day (BID) | INTRAMUSCULAR | Status: DC
  Administered 2012-07-12 – 2012-07-14 (×4): 3 mL via INTRAVENOUS

## 2012-07-11 MED ORDER — ACETAMINOPHEN 650 MG RE SUPP: 650.0000 mg | Freq: Four times a day (QID) | RECTAL | Status: DC | PRN

## 2012-07-11 MED ORDER — VITAMIN B-12 1000 MCG PO TABS
1000.0000 ug | ORAL_TABLET | Freq: Every day | ORAL | Status: DC
  Administered 2012-07-12 – 2012-07-14 (×3): 1000 ug via ORAL
  Filled 2012-07-11 (×3): qty 1

## 2012-07-11 MED ORDER — FERROUS SULFATE 325 (65 FE) MG PO TABS
325.0000 mg | ORAL_TABLET | Freq: Every day | ORAL | Status: DC
  Administered 2012-07-12 – 2012-07-14 (×3): 325 mg via ORAL
  Filled 2012-07-11 (×4): qty 1

## 2012-07-11 MED ORDER — ACETAMINOPHEN 325 MG PO TABS
650.0000 mg | ORAL_TABLET | Freq: Four times a day (QID) | ORAL | Status: DC | PRN
  Administered 2012-07-13: 650 mg via ORAL
  Filled 2012-07-11: qty 2

## 2012-07-11 MED ORDER — GABAPENTIN 100 MG PO CAPS
100.0000 mg | ORAL_CAPSULE | Freq: Two times a day (BID) | ORAL | Status: DC
  Administered 2012-07-12 – 2012-07-14 (×6): 100 mg via ORAL
  Filled 2012-07-11 (×8): qty 1

## 2012-07-11 MED ORDER — DIAZEPAM 2 MG PO TABS
2.0000 mg | ORAL_TABLET | Freq: Once | ORAL | Status: AC
  Administered 2012-07-11: 2 mg via ORAL
  Filled 2012-07-11: qty 1

## 2012-07-11 MED ORDER — DILTIAZEM HCL ER COATED BEADS 240 MG PO TB24
240.0000 mg | ORAL_TABLET | Freq: Every day | ORAL | Status: DC
  Administered 2012-07-12 – 2012-07-14 (×3): 240 mg via ORAL
  Filled 2012-07-11 (×3): qty 1

## 2012-07-11 MED ORDER — ONDANSETRON HCL 4 MG/2ML IJ SOLN: 4.0000 mg | Freq: Four times a day (QID) | INTRAMUSCULAR | Status: DC | PRN

## 2012-07-11 MED ORDER — IBUPROFEN 400 MG PO TABS
600.0000 mg | ORAL_TABLET | Freq: Once | ORAL | Status: AC
Start: 2012-07-11 — End: 2012-07-11
  Administered 2012-07-11: 600 mg via ORAL
  Filled 2012-07-11 (×2): qty 1

## 2012-07-11 MED ORDER — HYDROMORPHONE HCL PF 1 MG/ML IJ SOLN: 1.0000 mg | INTRAMUSCULAR | Status: AC | PRN

## 2012-07-11 MED ORDER — TRAMADOL HCL 50 MG PO TABS
50.0000 mg | ORAL_TABLET | ORAL | Status: DC | PRN
  Administered 2012-07-12 – 2012-07-14 (×4): 50 mg via ORAL
  Filled 2012-07-11 (×4): qty 1

## 2012-07-11 NOTE — ED Notes (Signed)
Pt fell off porch 5 weeks ago and fx ribs  . Pt recently  Has a cyst on lower back injected last week ,but pain has increased.

## 2012-07-11 NOTE — H&P (Signed)
Beth Ramos is an 78 y.o. female.   Patient was seen and examined on July 11, 2012 at 8:25 PM. PCP - Dr. Merlene Laughter. Chief Complaint: Left hip pain. HPI: 78 year old female with history of proximal relation, non-Hodgkin's lymphoma in remission, depression who is usually active and works as a Diplomatic Services operational officer in [employer] had a fall 5 weeks ago at her friend's house. Patient missed a step and fell back towards. Since then she has been having some mild right chest pain around the lower ribs and after a week started developing left hip pain. Initially she had gone to urgent care and was told she had right rib fracture. After which patient developed left hip pain which was worsening correctly on standing and putting on pressure. A week ago she had come to the ER and x-rays done at that time did not reveal any acute fracture. She had followed up with Dr. Alvester Morin, orthopedic surgeon office. She has a spinal cyst and gets steroid shots through Dr. Alvester Morin. Since she had persistent low back pain she was given another steroid shot. Usually these shots help her pain but not this time. Since the pain persisted and unable to walk she presented back to the ER. This time x-ray of the pelvis shows fractures of the left side of the pelvis. Orthopedic surgeon on call for Dr. Alvester Morin was consulted by ER physician at this time they have recommended pain control and physical therapy and if needed to consult them in a.m.  Past Medical History  Diagnosis Date  . Depression   . PONV (postoperative nausea and vomiting) 1960  . Anemia   . Lymphoma   . Anxiety     Past Surgical History  Procedure Date  . Abdominal hysterectomy 1960    partial  . Bilateral oophorectomy 1962  . Appendectomy 1948  . Cataract extraction, bilateral   . Esophagogastroduodenoscopy 11/06/2011    Procedure: ESOPHAGOGASTRODUODENOSCOPY (EGD);  Surgeon: Petra Kuba, MD;  Location: [facility];  Service: Endoscopy;  Laterality: N/A;    History  reviewed. No pertinent family history. Social History:  reports that she has never smoked. She has never used smokeless tobacco. She reports that she does not drink alcohol or use illicit drugs.  Allergies:  Allergies  Allergen Reactions  . Sulfa Antibiotics Nausea And Vomiting     (Not in a hospital admission)  No results found for this or any previous visit (from the past 48 hour(s)). Dg Pelvis 1-2 Views  07/11/2012  *RADIOLOGY REPORT*  Clinical Data: Pain since a fall 5 weeks ago.  PELVIS - 1-2 VIEW  Comparison: None.  Findings: The patient has a acute or subacute left superior pubic ramus and left parasymphyseal fractures.  Although not well seen, there is abnormal sclerosis in the left sacral ala compatible with fracture, age indeterminate.  The hips are located.  Except as noted, no fracture is identified.  IMPRESSION: Acute or subacute left superior pubic ramus and parasymphyseal pubic fractures.  Likely left sacral ala fracture is age indeterminate.  Original Report Authenticated By: Bernadene Bell. Maricela Curet, M.D.    Review of Systems  Constitutional: Negative.   HENT: Negative.   Eyes: Negative.   Respiratory: Negative.   Cardiovascular: Negative.   Gastrointestinal: Negative.   Genitourinary: Negative.   Musculoskeletal:       Left sided hip pain.  Skin: Negative.   Neurological: Negative.   Endo/Heme/Allergies: Negative.   Psychiatric/Behavioral: Negative.     Blood pressure 148/75, pulse 73, temperature  98.7 F (37.1 C), temperature source Oral, resp. rate 22, SpO2 100.00%. Physical Exam  Constitutional: She is oriented to person, place, and time. She appears well-developed and well-nourished. No distress.  HENT:  Head: Normocephalic and atraumatic.  Right Ear: External ear normal.  Left Ear: External ear normal.  Nose: Nose normal.  Mouth/Throat: Oropharynx is clear and moist. No oropharyngeal exudate.  Eyes: Conjunctivae are normal. Pupils are equal, round, and  reactive to light. Right eye exhibits no discharge. Left eye exhibits no discharge. No scleral icterus.  Neck: Normal range of motion. Neck supple.  Cardiovascular: Normal rate and regular rhythm.   Respiratory: Effort normal and breath sounds normal. No respiratory distress. She has no wheezes. She has no rales. She exhibits tenderness (Mild tenderness in the right lower ribs.).  GI: Soft. Bowel sounds are normal. She exhibits no distension. There is no tenderness. There is no rebound.  Musculoskeletal:       Pain on moving left lower extremity.  Neurological: She is alert and oriented to person, place, and time. No cranial nerve deficit.       Moves all extremities.  Skin: Skin is warm and dry. She is not diaphoretic.  Psychiatric: Her behavior is normal.     Assessment/Plan #1. Fractures of the left superior pubic primary and parasymphyseal and likely fracture of left sacral ala - patient will be placed on pain relief medications. Physical therapy consult has been requested. Patient lives alone and may need rehabilitation placement. As suggested earlier in the history of present illness may consult patient orthopedic surgeon Dr. Alvester Morin in a.m. #2. Leukocytosis - may be reactionary. Patient is afebrile. Chest chest x-ray and urinalysis. #3. Paroxysmal atrial fibrillation presently rate controlled - continue present medication (Cardizem). #4. Depression - continue present medications.  CODE STATUS - full code.  Eduard Clos 07/11/2012, 8:37 PM

## 2012-07-11 NOTE — ED Provider Notes (Addendum)
History     CSN: [id number]  Arrival date & time 07/11/12  1606   First MD Initiated Contact with Patient 07/11/12 1619      Chief Complaint  Patient presents with  . Back Pain    (Consider location/radiation/quality/duration/timing/severity/associated sxs/prior treatment) HPI Comments: 78 y/o female comes in with cc of back pain. Patient fell 4 weeks ago and was diagnosed with fractured ribs. She also has a cyst in her back, being evaluated by orthopedic doctors and treated with "injections," last session was on Tuesday. Pt presents today with worsening back pain. Pt was seen with the same complain in the ED few days ago, and had a L-S film that was negative.  Patient is located ober the sacral region and right ilial crest. Pt has been taking tramadol with no relief. Pt unable to walk today, and until now she has been barely able to walk even with a cane. She denies wekaness, numbness, urinary/ bowel incontinence, saddle anesthesia, fevers, chills, fatigue, night sweats, unexplained weight loss. Pt has had no improvement post recent injection with Dr. Alvester Morin.  Patient is a 78 y.o. female presenting with back pain. The history is provided by the patient and medical records.  Back Pain  Pertinent negatives include no chest pain, no headaches, no abdominal pain and no dysuria.    Past Medical History  Diagnosis Date  . Depression   . PONV (postoperative nausea and vomiting) 1960  . Anemia   . Lymphoma   . Anxiety     Past Surgical History  Procedure Date  . Abdominal hysterectomy 1960    partial  . Bilateral oophorectomy 1962  . Appendectomy 1948  . Cataract extraction, bilateral   . Esophagogastroduodenoscopy 11/06/2011    Procedure: ESOPHAGOGASTRODUODENOSCOPY (EGD);  Surgeon: Petra Kuba, MD;  Location: [facility];  Service: Endoscopy;  Laterality: N/A;    No family history on file.  History  Substance Use Topics  . Smoking status: Never Smoker   . Smokeless  tobacco: Never Used  . Alcohol Use: No    OB History    Grav Para Term Preterm Abortions TAB SAB Ect Mult Living                  Review of Systems  Constitutional: Positive for activity change.  HENT: Negative for facial swelling and neck pain.   Respiratory: Negative for cough, shortness of breath and wheezing.   Cardiovascular: Negative for chest pain.  Gastrointestinal: Negative for nausea, vomiting, abdominal pain, diarrhea, constipation, blood in stool and abdominal distention.  Genitourinary: Negative for dysuria, frequency, hematuria and difficulty urinating.  Musculoskeletal: Positive for back pain and gait problem.  Skin: Negative for color change.  Neurological: Negative for speech difficulty and headaches.  Hematological: Does not bruise/bleed easily.  Psychiatric/Behavioral: Negative for confusion.    Allergies  Sulfa antibiotics  Home Medications   Current Outpatient Rx  Name Route Sig Dispense Refill  . DILTIAZEM HCL ER COATED BEADS 240 MG PO TB24 Oral Take 240 mg by mouth daily.    . DULOXETINE HCL 60 MG PO CPEP Oral Take 60 mg by mouth daily.      Marland Kitchen FERROUS SULFATE 325 (65 FE) MG PO TABS Oral Take 325 mg by mouth daily with breakfast.      . GABAPENTIN 100 MG PO CAPS Oral Take 100 mg by mouth 2 (two) times daily.      Marland Kitchen HYDROCODONE-ACETAMINOPHEN 5-325 MG PO TABS Oral Take 1 tablet by mouth every  4 (four) hours as needed for pain. 15 tablet 0  . SALONPAS EX PADS Apply externally Apply 1 each topically daily as needed. Back pain    . MELOXICAM 7.5 MG PO TABS Oral Take 7.5 mg by mouth daily.    . TRAMADOL HCL 50 MG PO TABS Oral Take 50 mg by mouth every 4 (four) hours as needed. For pain    . VITAMIN B-12 1000 MCG PO TABS Oral Take 1,000 mcg by mouth daily.      Marland Kitchen VITAMIN C 500 MG PO TABS Oral Take 500 mg by mouth daily.        BP 141/67  Pulse 79  Temp 97.9 F (36.6 C) (Oral)  Resp 20  SpO2 98%  Physical Exam  Nursing note and vitals  reviewed. Constitutional: She is oriented to person, place, and time. She appears well-developed.  HENT:  Head: Normocephalic and atraumatic.  Eyes: Conjunctivae and EOM are normal. Pupils are equal, round, and reactive to light.  Neck: Normal range of motion. Neck supple.  Cardiovascular: Normal rate, regular rhythm and normal heart sounds.   Pulmonary/Chest: Effort normal and breath sounds normal. No respiratory distress.  Abdominal: Soft. Bowel sounds are normal. She exhibits no distension. There is no tenderness. There is no rebound and no guarding.  Musculoskeletal:       Pt has sacral pain with palpation, and right iliac crest tenderness. Pt also has tenderness with internal and external hip rotation. Neurovascularly intact.  Neurological: She is alert and oriented to person, place, and time.  Skin: Skin is warm and dry.    ED Course  Procedures (including critical care time)  Labs Reviewed - No data to display No results found.   No diagnosis found.    MDM  DDx includes: - DJD of the back - Spondylitises/ spondylosis - Sciatica - Spinal cord compression - Conus medullaris - Epidural hematoma - Epidural abscess - Lytic/pathologic fracture - Myelitis - Musculoskeletal pain - Muscle spasms  Pt comes in with cc of back pain. Pt had a recent traumatic incident, and has intervertebral cyst. The back pain is in the region of the cyst, but she had no resolution of the pain after recent treatment. Pt has high pain tolerance, but currently failing outpatient therapy for pain. She also is very independent, but now unable to walk properly. Diagnostic workup includes L-S radiographs that were negative.  We will get a pelvis film today 1st, as the pain is more over the iliac crest. If the results are negative, we will consider CT pelvis - and look for any occult fractures. Pt might need admission for pain control and inability to ambulate.   Derwood Kaplan, MD 07/11/12  1758  8:06 PM Pelvic film shows fracture - subacute. Spoke with Physician on call for [facility], as patient has seen Dr. Naaman Plummer recently - and their recommendation are as following: - no acute surgical interventions. - Admit for pain control, for pt/ot and possible placement. - Admitting team to consult ortho if needed.   Derwood Kaplan, MD 07/11/12 2008

## 2012-07-12 ENCOUNTER — Inpatient Hospital Stay (HOSPITAL_COMMUNITY): Payer: Medicare Other

## 2012-07-12 ENCOUNTER — Encounter (HOSPITAL_COMMUNITY): Payer: Self-pay | Admitting: *Deleted

## 2012-07-12 DIAGNOSIS — D72829 Elevated white blood cell count, unspecified: Secondary | ICD-10-CM

## 2012-07-12 LAB — COMPREHENSIVE METABOLIC PANEL
AST: 16 U/L (ref 0–37)
Albumin: 3.1 g/dL — ABNORMAL LOW (ref 3.5–5.2)
Calcium: 9.3 mg/dL (ref 8.4–10.5)
Creatinine, Ser: 0.53 mg/dL (ref 0.50–1.10)
GFR calc non Af Amer: 84 mL/min — ABNORMAL LOW (ref >90)
Total Protein: 5.7 g/dL — ABNORMAL LOW (ref 6.0–8.3)

## 2012-07-12 LAB — URINALYSIS, ROUTINE W REFLEX MICROSCOPIC
Glucose, UA: NEGATIVE mg/dL
Hgb urine dipstick: NEGATIVE
Ketones, ur: NEGATIVE mg/dL
Protein, ur: NEGATIVE mg/dL
pH: 6 (ref 5.0–8.0)

## 2012-07-12 LAB — CBC
HCT: 37.5 % (ref 36.0–46.0)
Hemoglobin: 12.5 g/dL (ref 12.0–15.0)
MCH: 31.9 pg (ref 26.0–34.0)
MCV: 95.7 fL (ref 78.0–100.0)
RBC: 3.92 MIL/uL (ref 3.87–5.11)

## 2012-07-12 LAB — URINE MICROSCOPIC-ADD ON

## 2012-07-12 MED ORDER — HYDRALAZINE HCL 20 MG/ML IJ SOLN
10.0000 mg | INTRAMUSCULAR | Status: DC | PRN
  Filled 2012-07-12: qty 0.5

## 2012-07-12 MED ORDER — ENOXAPARIN SODIUM 40 MG/0.4ML ~~LOC~~ SOLN
40.0000 mg | Freq: Every day | SUBCUTANEOUS | Status: DC
  Administered 2012-07-12 – 2012-07-14 (×3): 40 mg via SUBCUTANEOUS
  Filled 2012-07-12 (×3): qty 0.4

## 2012-07-12 NOTE — Consult Note (Signed)
Reason for Consult: Left hip pain Referring Physician Dr. Sharl Ma:   Beth Ramos is an 78 y.o. female.  HPI: Beth Ramos is a 78 year old female who lives alone he describes a 10 day history of atraumatic onset left hip pain. [facility] where radiographs were negative. She was subsequently seen at [facility] orthopedics for back injections but her hip pain persisted. She presents now with age indeterminant pubic rami fractures on the left. Denies any other orthopedic complaints other than baseline amount of low back pain. She takes Cymbalta and Neurontin and tramadol for pain. She states that other stronger narcotic medications make her "woozy"  Past Medical History  Diagnosis Date  . Depression   . PONV (postoperative nausea and vomiting) 1960  . Anemia   . Lymphoma   . Anxiety     Past Surgical History  Procedure Date  . Abdominal hysterectomy 1960    partial  . Bilateral oophorectomy 1962  . Appendectomy 1948  . Cataract extraction, bilateral   . Esophagogastroduodenoscopy 11/06/2011    Procedure: ESOPHAGOGASTRODUODENOSCOPY (EGD);  Surgeon: Petra Kuba, MD;  Location: [facility];  Service: Endoscopy;  Laterality: N/A;    History reviewed. No pertinent family history.  Social History:  reports that she has never smoked. She has never used smokeless tobacco. She reports that she does not drink alcohol or use illicit drugs.  Allergies:  Allergies  Allergen Reactions  . Sulfa Antibiotics Nausea And Vomiting    Medications: I have reviewed the patient's current medications.  Results for orders placed during the hospital encounter of 07/11/12 (from the past 48 hour(s))  CBC WITH DIFFERENTIAL     Status: Abnormal   Collection Time   07/11/12  8:23 PM      Component Value Range Comment   WBC 14.5 (*) 4.0 - 10.5 K/uL    RBC 3.99  3.87 - 5.11 MIL/uL    Hemoglobin 13.0  12.0 - 15.0 g/dL    HCT 28.4  13.2 - 44.0 %    MCV 96.0  78.0 -  100.0 fL    MCH 32.6  26.0 - 34.0 pg    MCHC 33.9  30.0 - 36.0 g/dL    RDW 10.2  72.5 - 36.6 %    Platelets 323  150 - 400 K/uL    Neutrophils Relative 67  43 - 77 %    Lymphocytes Relative 21  12 - 46 %    Monocytes Relative 10  3 - 12 %    Eosinophils Relative 1  0 - 5 %    Basophils Relative 1  0 - 1 %    Neutro Abs 9.8 (*) 1.7 - 7.7 K/uL    Lymphs Abs 3.0  0.7 - 4.0 K/uL    Monocytes Absolute 1.5 (*) 0.1 - 1.0 K/uL    Eosinophils Absolute 0.1  0.0 - 0.7 K/uL    Basophils Absolute 0.1  0.0 - 0.1 K/uL    WBC Morphology TOXIC GRANULATION   MILD LEFT SHIFT (1-5% METAS, OCC MYELO, OCC BANDS)  COMPREHENSIVE METABOLIC PANEL     Status: Abnormal   Collection Time   07/11/12  8:23 PM      Component Value Range Comment   Sodium 134 (*) 135 - 145 mEq/L    Potassium 3.8  3.5 - 5.1 mEq/L    Chloride 98  96 - 112 mEq/L    CO2 26  19 - 32  mEq/L    Glucose, Bld 129 (*) 70 - 99 mg/dL    BUN 19  6 - 23 mg/dL    Creatinine, Ser 1.61  0.50 - 1.10 mg/dL    Calcium 9.7  8.4 - 09.6 mg/dL    Total Protein 6.3  6.0 - 8.3 g/dL    Albumin 3.4 (*) 3.5 - 5.2 g/dL    AST 21  0 - 37 U/L    ALT 28  0 - 35 U/L    Alkaline Phosphatase 303 (*) 39 - 117 U/L    Total Bilirubin 0.4  0.3 - 1.2 mg/dL    GFR calc non Af Amer 83 (*) >90 mL/min    GFR calc Af Amer >90  >90 mL/min   COMPREHENSIVE METABOLIC PANEL     Status: Abnormal   Collection Time   07/12/12  5:03 AM      Component Value Range Comment   Sodium 137  135 - 145 mEq/L    Potassium 4.1  3.5 - 5.1 mEq/L    Chloride 101  96 - 112 mEq/L    CO2 27  19 - 32 mEq/L    Glucose, Bld 93  70 - 99 mg/dL    BUN 20  6 - 23 mg/dL    Creatinine, Ser 0.45  0.50 - 1.10 mg/dL    Calcium 9.3  8.4 - 40.9 mg/dL    Total Protein 5.7 (*) 6.0 - 8.3 g/dL    Albumin 3.1 (*) 3.5 - 5.2 g/dL    AST 16  0 - 37 U/L    ALT 22  0 - 35 U/L    Alkaline Phosphatase 278 (*) 39 - 117 U/L    Total Bilirubin 0.4  0.3 - 1.2 mg/dL    GFR calc non Af Amer 84 (*) >90 mL/min    GFR  calc Af Amer >90  >90 mL/min   CBC     Status: Abnormal   Collection Time   07/12/12  5:03 AM      Component Value Range Comment   WBC 12.8 (*) 4.0 - 10.5 K/uL    RBC 3.92  3.87 - 5.11 MIL/uL    Hemoglobin 12.5  12.0 - 15.0 g/dL    HCT 81.1  91.4 - 78.2 %    MCV 95.7  78.0 - 100.0 fL    MCH 31.9  26.0 - 34.0 pg    MCHC 33.3  30.0 - 36.0 g/dL    RDW 95.6  21.3 - 08.6 %    Platelets 320  150 - 400 K/uL   URINALYSIS, ROUTINE W REFLEX MICROSCOPIC     Status: Abnormal   Collection Time   07/12/12  5:18 AM      Component Value Range Comment   Color, Urine YELLOW  YELLOW    APPearance CLEAR  CLEAR    Specific Gravity, Urine 1.017  1.005 - 1.030    pH 6.0  5.0 - 8.0    Glucose, UA NEGATIVE  NEGATIVE mg/dL    Hgb urine dipstick NEGATIVE  NEGATIVE    Bilirubin Urine NEGATIVE  NEGATIVE    Ketones, ur NEGATIVE  NEGATIVE mg/dL    Protein, ur NEGATIVE  NEGATIVE mg/dL    Urobilinogen, UA 1.0  0.0 - 1.0 mg/dL    Nitrite NEGATIVE  NEGATIVE    Leukocytes, UA SMALL (*) NEGATIVE   URINE MICROSCOPIC-ADD ON     Status: Abnormal   Collection Time   07/12/12  5:18  AM      Component Value Range Comment   Squamous Epithelial / LPF FEW (*) RARE    WBC, UA 3-6  <3 WBC/hpf    RBC / HPF 0-2  <3 RBC/hpf    Bacteria, UA MANY (*) RARE     Dg Pelvis 1-2 Views  07/11/2012  *RADIOLOGY REPORT*  Clinical Data: Pain since a fall 5 weeks ago.  PELVIS - 1-2 VIEW  Comparison: None.  Findings: The patient has a acute or subacute left superior pubic ramus and left parasymphyseal fractures.  Although not well seen, there is abnormal sclerosis in the left sacral ala compatible with fracture, age indeterminate.  The hips are located.  Except as noted, no fracture is identified.  IMPRESSION: Acute or subacute left superior pubic ramus and parasymphyseal pubic fractures.  Likely left sacral ala fracture is age indeterminate.  Original Report Authenticated By: Bernadene Bell. Maricela Curet, M.D.   Dg Chest Port 1 View  07/12/2012   *RADIOLOGY REPORT*  Clinical Data: Pelvic bone fractures.  PORTABLE CHEST - 1 VIEW  Comparison: 11/04/2011  Findings: There are multiple bilateral rib fractures.  Many of the fractures appear new since the prior examination but may be subacute due to the evidence of callus formation.  Patchy densities in the periphery the right lung may be related to the healing rib fractures.  Heart and mediastinum are within normal limits.  There is evidence for a hiatal hernia.  No evidence for a pneumothorax.  IMPRESSION: Bilateral rib fractures.  Suspect that many of these fractures are at least subacute since there is evidence for callus formation.  No evidence for a pneumothorax.  Hiatal hernia.  Limited evaluation for subtle parenchymal disease due to the rib fractures but no significant airspace disease.   Original Report Authenticated By: Richarda Overlie, M.D. ( 07/12/2012 08:16:44 )     Review of Systems  Constitutional: Negative.   HENT: Negative.   Eyes: Negative.   Respiratory: Negative.   Cardiovascular: Negative.   Gastrointestinal: Negative.   Genitourinary: Negative.   Musculoskeletal: Positive for joint pain.  Skin: Negative.   Neurological: Negative.   Endo/Heme/Allergies: Negative.   Psychiatric/Behavioral: Positive for depression.   Blood pressure 141/68, pulse 87, temperature 97.8 F (36.6 C), temperature source Oral, resp. rate 16, SpO2 98.00%. Physical Exam  Constitutional: She appears well-developed.  HENT:  Head: Normocephalic.  Eyes: Pupils are equal, round, and reactive to light.  Neck: Normal range of motion.  Cardiovascular: Normal rate.   Respiratory: Effort normal.  GI: Soft.   left lower extremity demonstrates intact dorsiflexion plantar flexion with perfused and sensate foot she does have mild pain with range of motion of the left hip no knee effusions does have pain with weightbearing on the left-hand side  Assessment/Plan: Impression is left mildly displaced inferior and  superior pubic rami fractures. Plan is for mobilization with physical therapy as tolerated. She'll need skilled nursing home placement for one to 2 weeks until she is able to put more weight on the left leg. She should followup with me at [facility] orthopedics for clinical recheck in 2 weeks. I think that her current pain management should be adequate for pain control based on her history.  DEAN,GREGORY SCOTT 07/12/2012, 11:16 AM

## 2012-07-12 NOTE — Progress Notes (Signed)
Subjective: Patient seen and examined, admitted for pelvic fracture. Patient says she is not in pain at this time.  Objective: Vital signs in last 24 hours: Temp:  [97.7 F (36.5 C)-98.7 F (37.1 C)] 97.8 F (36.6 C) (08/19 0539) Pulse Rate:  [73-87] 87  (08/19 0539) Resp:  [16-22] 16  (08/19 0539) BP: (131-154)/(58-75) 141/68 mmHg (08/19 0539) SpO2:  [94 %-100 %] 98 % (08/19 0539) Weight change:  Last BM Date: 07/11/12  Intake/Output from previous day: 08/18 0701 - 08/19 0700 In: 60 [P.O.:60] Out: -  Total I/O In: 480 [P.O.:480] Out: -    Physical Exam: Head: Normocephalic, atraumatic.  Eyes: No signs of jaundice, EOMI Nose: Mucous membranes dry.  Neck: supple,No deformities, masses, or tenderness noted. Lungs: Normal respiratory effort. B/L Clear to auscultation, no crackles or wheezes.  Heart: Regular RR. S1 and S2 normal  Abdomen: BS normoactive. Soft, Nondistended, non-tender.  Extremities: No pretibial edema, no erythema   Lab Results: Basic Metabolic Panel:  Basename 07/12/12 0503 07/11/12 2023  NA 137 134*  K 4.1 3.8  CL 101 98  CO2 27 26  GLUCOSE 93 129*  BUN 20 19  CREATININE 0.53 0.54  CALCIUM 9.3 9.7  MG -- --  PHOS -- --   Liver Function Tests:  Basename 07/12/12 0503 07/11/12 2023  AST 16 21  ALT 22 28  ALKPHOS 278* 303*  BILITOT 0.4 0.4  PROT 5.7* 6.3  ALBUMIN 3.1* 3.4*   No results found for this basename: LIPASE:2,AMYLASE:2 in the last 72 hours No results found for this basename: AMMONIA:2 in the last 72 hours CBC:  Basename 07/12/12 0503 07/11/12 2023  WBC 12.8* 14.5*  NEUTROABS -- 9.8*  HGB 12.5 13.0  HCT 37.5 38.3  MCV 95.7 96.0  PLT 320 323   Urinalysis:  Basename 07/12/12 0518  COLORURINE YELLOW  LABSPEC 1.017  PHURINE 6.0  GLUCOSEU NEGATIVE  HGBUR NEGATIVE  BILIRUBINUR NEGATIVE  KETONESUR NEGATIVE  PROTEINUR NEGATIVE  UROBILINOGEN 1.0  NITRITE NEGATIVE  LEUKOCYTESUR SMALL*    No results found for this or  any previous visit (from the past 240 hour(s)).  Studies/Results: Dg Pelvis 1-2 Views  07/11/2012  *RADIOLOGY REPORT*  Clinical Data: Pain since a fall 5 weeks ago.  PELVIS - 1-2 VIEW  Comparison: None.  Findings: The patient has a acute or subacute left superior pubic ramus and left parasymphyseal fractures.  Although not well seen, there is abnormal sclerosis in the left sacral ala compatible with fracture, age indeterminate.  The hips are located.  Except as noted, no fracture is identified.  IMPRESSION: Acute or subacute left superior pubic ramus and parasymphyseal pubic fractures.  Likely left sacral ala fracture is age indeterminate.  Original Report Authenticated By: Bernadene Bell. Maricela Curet, M.D.   Dg Chest Port 1 View  07/12/2012  *RADIOLOGY REPORT*  Clinical Data: Pelvic bone fractures.  PORTABLE CHEST - 1 VIEW  Comparison: 11/04/2011  Findings: There are multiple bilateral rib fractures.  Many of the fractures appear new since the prior examination but may be subacute due to the evidence of callus formation.  Patchy densities in the periphery the right lung may be related to the healing rib fractures.  Heart and mediastinum are within normal limits.  There is evidence for a hiatal hernia.  No evidence for a pneumothorax.  IMPRESSION: Bilateral rib fractures.  Suspect that many of these fractures are at least subacute since there is evidence for callus formation.  No evidence for a pneumothorax.  Hiatal hernia.  Limited evaluation for subtle parenchymal disease due to the rib fractures but no significant airspace disease.   Original Report Authenticated By: Richarda Overlie, M.D. ( 07/12/2012 08:16:44 )     Medications: Scheduled Meds:   . diazepam  2 mg Oral Once  . diltiazem  240 mg Oral Daily  . DULoxetine  60 mg Oral Daily  . ferrous sulfate  325 mg Oral Q breakfast  . gabapentin  100 mg Oral BID  . HYDROcodone-acetaminophen  2 tablet Oral Once  . ibuprofen  600 mg Oral Once  . sodium chloride   3 mL Intravenous Q12H  . vitamin B-12  1,000 mcg Oral Daily   Continuous Infusions:  PRN Meds:.acetaminophen, acetaminophen, hydrALAZINE, HYDROmorphone (DILAUDID) injection, HYDROmorphone (DILAUDID) injection, ondansetron (ZOFRAN) IV, ondansetron, oxyCODONE, traMADol  Assessment/Plan:  Principal Problem:  *Pelvic fracture Active Problems:  Paroxysmal a-fib  H/O: depression  Pelvic fracture Continue with pain control with Oxycodone and Tramadol prn Called Dr Alvester Morin, who will have his one of partners see the patient in the hospital  Rib fracture Pain management  Paroxysmal atrial Fibrillation Continue with Diltiazem  Depression Continue Cymbalta  DVT prophylaxis Lovenox  LOS: 1 day   [facility] Pager: [phone] 07/12/2012, 10:18 AM

## 2012-07-12 NOTE — Evaluation (Addendum)
Physical Therapy Evaluation Patient Details Name: Beth Ramos MRN: [id number] DOB: [birth year] Today's Date: 07/12/2012 Time: 1340-1410 PT Time Calculation (min): 30 min  PT Assessment / Plan / Recommendation Clinical Impression  Pt is 78 y/o female admitted for continue back and hip pain due to fall ~ 5 weeks.  Pt found to have pelvic fractures limiting overall mobility.  Pt will benefit from acute PT services to improve overall mobility and prepare for safe d/c to next venue.  Pt unable to sit in recliner due to 10+/10 pelvic pain therefore return to bed with HOB elevated.     PT Assessment  Patient needs continued PT services    Follow Up Recommendations  CIR - To become Mod I prior to d/c home.    Barriers to Discharge        Equipment Recommendations  None recommended by PT    Recommendations for Other Services   OT consult, Inpatient Rehab Consult  Frequency Min 5X/week    Precautions / Restrictions Precautions Precautions: None Restrictions Weight Bearing Restrictions: Yes RLE Weight Bearing: Weight bearing as tolerated LLE Weight Bearing: Partial weight bearing LLE Partial Weight Bearing Percentage or Pounds: 75 (taught pt 50%)   Pertinent Vitals/Pain 3/10 constant pain; increase 7/10 pelvic pain with ambulation; 10+/10 pelvic pain while sitting in recliner.       Mobility  Bed Mobility Bed Mobility: Supine to Sit Supine to Sit: 4: Min guard Details for Bed Mobility Assistance: Minguard for safety with use of rails and VCs for technique. Transfers Transfers: Sit to Stand;Stand to Sit Sit to Stand: 1: +2 Total assist;From bed Sit to Stand: Patient Percentage: 60% Stand to Sit: 1: +2 Total assist;To chair/3-in-1;To bed Stand to Sit: Patient Percentage: 70% Details for Transfer Assistance: +2 (A) for safety with cues for hand placement and (A) to initiate transfer and slowly descend to recliner.   Ambulation/Gait Ambulation/Gait Assistance: 4: Min  assist Ambulation Distance (Feet): 100 Feet Assistive device: Rolling walker Ambulation/Gait Assistance Details: (A) to maintain balance with cues for proper body position within RW.  Pt needs extra time due to pain and several standing rest breaks. Gait Pattern: Step-to pattern;Decreased step length - right;Decreased step length - left;Shuffle;Trunk flexed;Decreased trunk rotation    Exercises     PT Diagnosis: Difficulty walking;Abnormality of gait;Acute pain  PT Problem List: Decreased strength;Decreased range of motion;Decreased activity tolerance;Decreased balance;Decreased mobility;Decreased knowledge of use of DME;Pain PT Treatment Interventions: DME instruction;Gait training;Stair training;Functional mobility training;Therapeutic activities;Therapeutic exercise;Balance training;Patient/family education   PT Goals Acute Rehab PT Goals PT Goal Formulation: With patient Time For Goal Achievement: 07/19/12 Potential to Achieve Goals: Good Pt will go Supine/Side to Sit: with modified independence PT Goal: Supine/Side to Sit - Progress: Goal set today Pt will go Sit to Supine/Side: with modified independence PT Goal: Sit to Supine/Side - Progress: Goal set today Pt will go Sit to Stand: with modified independence PT Goal: Sit to Stand - Progress: Goal set today Pt will go Stand to Sit: with modified independence PT Goal: Stand to Sit - Progress: Goal set today Pt will Ambulate: 51 - 150 feet;with modified independence;with rolling walker PT Goal: Ambulate - Progress: Goal set today  Visit Information  Last PT Received On: 07/12/12 Assistance Needed: +2 (safety)    Subjective Data  Subjective: "Ideally I would like to go home." Patient Stated Goal: To return home and return to work at [employer]   Prior Functioning  Home Living Lives With: Alone Available Help at Discharge:  Family;Available PRN/intermittently Type of Home: House Home Access: Stairs to enter [employer] of Steps: 6 Entrance Stairs-Rails: Right;Left;Can reach both Home Layout: One level Bathroom Shower/Tub: Forensic scientist: Standard Bathroom Accessibility: Yes How Accessible: Accessible via walker Home Adaptive Equipment: Straight cane;Walker - rolling;Grab bars in shower Prior Function Level of Independence: Independent with assistive device(s) (Pt has been using RW ~ 1week due to pain) Able to Take Stairs?: Yes Driving: Yes 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[employer]; 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) Musician: No difficulties Dominant Hand: Right    Cognition  Overall Cognitive Status: Appears within functional limits for tasks assessed/performed Arousal/Alertness: Awake/alert Orientation Level: Appears intact for tasks assessed Behavior During Session: [facility] for tasks performed    Extremity/Trunk Assessment Right Upper Extremity Assessment RUE ROM/Strength/Tone: Within functional levels Left Upper Extremity Assessment LUE ROM/Strength/Tone: Within functional levels Right Lower Extremity Assessment RLE ROM/Strength/Tone: Unable to fully assess;Due to pain Left Lower Extremity Assessment LLE ROM/Strength/Tone: Unable to fully assess;Due to pain   Balance    End of Session PT - End of Session Equipment Utilized During Treatment: Back brace Activity Tolerance: Patient limited by pain Patient left: in bed;with call bell/phone within reach Nurse Communication: Mobility status  GP     Cora Stetson 07/12/2012, 2:34 PM Jake Shark, PT DPT [phone]

## 2012-07-12 NOTE — Clinical Social Work Psychosocial (Signed)
     Clinical Social Work Department BRIEF PSYCHOSOCIAL ASSESSMENT 07/12/2012  Patient:  Beth Ramos, Beth Ramos     Account Number:  [id number]     Admit date:  07/11/2012  Clinical Social Worker:  Burnard Hawthorne  Date/Time:  07/12/2012 01:30 PM  Referred by:  Physician  Date Referred:  07/12/2012 Referred for  SNF Placement   Other Referral:   Patient does not want SNF- she wants CIR   Interview type:  Patient Other interview type:    PSYCHOSOCIAL DATA Living Status:  ALONE Admitted from facility:   Level of care:   Primary support name:   Primary support relationship to patient:  CHILD, ADULT Degree of support available:   Strong support by daughter and other family members. Patient relates that she can go stay with her daughter if she wants to. Patient would prefer to return to her own home    CURRENT CONCERNS Current Concerns  Post-Acute Placement   Other Concerns:    SOCIAL WORK ASSESSMENT / PLAN Met with patient today to discuss d/c disposition. She stated "I want to go to rehab here at [facility]". Discussed CIR placement process as well as "back up" option of SNF if CIR cannnot admit. Patient is reluctant- states she would prefer to go home. She lives alone and acknowledges that going home alone in her current condition would not be beneficial.  Patient relates that she "could" go stay with her daughter if she had to but doesnt' really want to.  Patient agreed to SNF search but is doubtful that she would accept a bed. Wants CIR.  FL2 placed on shadow chart;  contacted pt's nurse Hansel Starling to contact MD for CIR, PT/OT order.  Will monitor. Patient has [employer]- will need to start this process  along with bed search.   Assessment/plan status:  Psychosocial Support/Ongoing Assessment of Needs Other assessment/ plan:   Information/referral to community resources:   SNF bed list provided  Discussed CIR and assessment process    PATIENTS/FAMILYS RESPONSE TO PLAN OF  CARE: Patient is alert and oriented; very pleasant. She states that she lives alone and is very independent and active. She does not want to go to rehab in a SNF and feels strongly about this. She is hopeful to be accepted at [facility]. CSW will monitor and assist with d/c plan as indicated.

## 2012-07-13 DIAGNOSIS — S329XXA Fracture of unspecified parts of lumbosacral spine and pelvis, initial encounter for closed fracture: Secondary | ICD-10-CM

## 2012-07-13 DIAGNOSIS — W19XXXA Unspecified fall, initial encounter: Secondary | ICD-10-CM

## 2012-07-13 DIAGNOSIS — R262 Difficulty in walking, not elsewhere classified: Secondary | ICD-10-CM

## 2012-07-13 NOTE — Consult Note (Signed)
Physical Medicine and Rehabilitation Consult Reason for Consult: Pelvic fracture Referring Physician: [facility]   HPI: Beth Ramos is a 78 y.o. right-handed female with history of non-Hodgkin's lymphoma that is in remission. Patient independent prior to admission and was admitted 1813 after a fall when she missed a step without loss of consciousness. X-rays and imaging revealed fractures of left superior pubic region and likely fracture left sacral ala. Orthopedic services consulted Dr. August Saucer and advised partial weightbearing left lower extremity weightbearing as tolerated right lower extremity. Pain control ongoing. Placed on subcutaneous Lovenox for DVT prophylaxis. Physical therapy is recommended physical medicine rehabilitation consult to consider inpatient rehabilitation services   Review of Systems  Gastrointestinal: Positive for constipation.  Musculoskeletal: Positive for myalgias.  All other systems reviewed and are negative.   Past Medical History  Diagnosis Date  . Depression   . PONV (postoperative nausea and vomiting) 1960  . Anemia   . Lymphoma   . Anxiety    Past Surgical History  Procedure Date  . Abdominal hysterectomy 1960    partial  . Bilateral oophorectomy 1962  . Appendectomy 1948  . Cataract extraction, bilateral   . Esophagogastroduodenoscopy 11/06/2011    Procedure: ESOPHAGOGASTRODUODENOSCOPY (EGD);  Surgeon: Petra Kuba, MD;  Location: [facility];  Service: Endoscopy;  Laterality: N/A;   History reviewed. No pertinent family history. Social History:  reports that she has never smoked. She has never used smokeless tobacco. She reports that she does not drink alcohol or use illicit drugs. Allergies:  Allergies  Allergen Reactions  . Sulfa Antibiotics Nausea And Vomiting   Medications Prior to Admission  Medication Sig Dispense Refill  . diltiazem (MATZIM LA) 240 MG 24 hr tablet Take 240 mg by mouth daily.      . DULoxetine (CYMBALTA) 60 MG capsule  Take 60 mg by mouth daily.        . ferrous sulfate 325 (65 FE) MG tablet Take 325 mg by mouth daily with breakfast.        . gabapentin (NEURONTIN) 100 MG capsule Take 100 mg by mouth 2 (two) times daily.        Marland Kitchen HYDROcodone-acetaminophen (NORCO/VICODIN) 5-325 MG per tablet Take 1 tablet by mouth every 4 (four) hours as needed for pain.  15 tablet  0  . Liniments (SALONPAS) PADS Apply 1 each topically daily as needed. Back pain      . meloxicam (MOBIC) 7.5 MG tablet Take 7.5 mg by mouth daily.      . traMADol (ULTRAM) 50 MG tablet Take 50 mg by mouth every 4 (four) hours as needed. For pain      . vitamin B-12 (CYANOCOBALAMIN) 1000 MCG tablet Take 1,000 mcg by mouth daily.        . vitamin C (ASCORBIC ACID) 500 MG tablet Take 500 mg by mouth daily.          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  Pt needs extra time due to pain and several standing rest breaks. Gait Pattern: Step-to pattern;Decreased step length - right;Decreased step length - left;Shuffle;Trunk flexed;Decreased trunk rotation    ADL:    Cognition: Cognition Arousal/Alertness: Awake/alert Cognition Overall Cognitive Status: Appears within functional limits for tasks assessed/performed Arousal/Alertness: Awake/alert Orientation Level: Appears intact for tasks assessed Behavior During Session: [facility] for tasks performed  Blood pressure 126/51, pulse 71, temperature 97.8 F (36.6 C), temperature source Oral, resp. rate 18, SpO2 97.00%. Physical Exam  Vitals reviewed. Constitutional: She is oriented to person, place, and time. She appears well-developed.  HENT:  Head: Normocephalic and atraumatic.  Right Ear: External ear normal.  Left Ear: External ear normal.  Mouth/Throat: Oropharynx is clear and moist.  Eyes:       Pupils round and reactive to light  Neck: Neck supple. No thyromegaly present.  Cardiovascular: Normal rate and regular rhythm.   No murmur heard. Pulmonary/Chest: Breath sounds normal. She is in respiratory distress. She has no wheezes.  Abdominal: Bowel sounds are normal. She exhibits no distension.  Musculoskeletal: She exhibits no edema.       Mild pain in the left hip with HF.   Neurological: She is alert and oriented to person, place, and time. She has normal strength and normal reflexes. A cranial nerve deficit is present. No sensory deficit.  Skin: Skin is warm and dry.  Psychiatric: She has a normal mood and affect. Her behavior is normal. Judgment and thought content normal.    No results found for this or any previous visit (from the past 24 hour(s)). Dg Pelvis 1-2 Views  07/11/2012  *RADIOLOGY REPORT*  Clinical Data: Pain since a fall 5 weeks ago.  PELVIS - 1-2 VIEW  Comparison: None.  Findings: The patient has a acute or  subacute left superior pubic ramus and left parasymphyseal fractures.  Although not well seen, there is abnormal sclerosis in the left sacral ala compatible with fracture, age indeterminate.  The hips are located.  Except as noted, no fracture is identified.  IMPRESSION: Acute or subacute left superior pubic ramus and parasymphyseal pubic fractures.  Likely left sacral ala fracture is age indeterminate.  Original Report Authenticated By: Bernadene Bell. Maricela Curet, M.D.   Dg Chest Port 1 View  07/12/2012  *RADIOLOGY REPORT*  Clinical Data: Pelvic bone fractures.  PORTABLE CHEST - 1 VIEW  Comparison: 11/04/2011  Findings: There are multiple bilateral rib fractures.  Many of the fractures appear new since the prior examination but may be subacute due to the evidence of callus formation.  Patchy densities in the periphery the right lung may be related to the healing rib fractures.  Heart and mediastinum are within normal limits.  There is evidence for a hiatal hernia.  No evidence for a pneumothorax.  IMPRESSION: Bilateral rib fractures.  Suspect that many of these fractures are at least subacute since there is evidence for callus formation.  No evidence for a pneumothorax.  Hiatal hernia.  Limited evaluation for subtle parenchymal disease due to the rib fractures but no significant airspace disease.   Original Report Authenticated  By: Richarda Overlie, M.D. ( 07/12/2012 08:16:44 )     Assessment/Plan: Diagnosis: left superior ramus, parasymphyseal, and sacral ala fxs. 1. Does the need for close, 24 hr/day medical supervision in concert with the patient's rehab needs make it unreasonable for this patient to be served in a less intensive setting? No 2. Co-Morbidities requiring supervision/potential complications: n/a 3. Due to bladder management, bowel management, skin/wound care, disease management and medication administration, does the patient require 24 hr/day rehab nursing? No 4. Does the patient require coordinated  care of a physician, rehab nurse, PT (1-2 hrs/day, 5 days/week) and OT (1-2 hrs/day, 5 days/week) to address physical and functional deficits in the context of the above medical diagnosis(es)? No Addressing deficits in the following areas: balance, endurance, locomotion, strength, transferring, bowel/bladder control, bathing and grooming 5. Can the patient actively participate in an intensive therapy program of at least 3 hrs of therapy per day at least 5 days per week? Yes 6. The potential for patient to make measurable gains while on inpatient rehab is fair 7. Anticipated functional outcomes upon discharge from inpatient rehab are n/a. 8. Estimated rehab length of stay to reach the above functional goals is: n/a 9. Does the patient have adequate social supports to accommodate these discharge functional goals? Yes 10. Anticipated D/C setting: other 11. Anticipated post D/C treatments: other 12. Overall Rehab/Functional Prognosis: excellent  RECOMMENDATIONS: This patient's condition is appropriate for continued rehabilitative care in the following setting: SNF Patient has agreed to participate in recommended program. Yes Note that insurance prior authorization may be required for reimbursement for recommended care.  Comment: The patient does not have help at home during the day (at the very least) and really doesn't have medical necessity to require inpatient rehab services.   Ivory Broad, MD  07/13/2012

## 2012-07-13 NOTE — Clinical Social Work Placement (Addendum)
    Clinical Social Work Department CLINICAL SOCIAL WORK PLACEMENT NOTE 07/13/2012  Patient:  Beth Ramos, Beth Ramos  Account Number:  [id number] Admit date:  07/11/2012  Clinical Social Worker:  Lupita Leash Almendra Loria, BSW  Date/time:  07/12/2012 02:27 PM  Clinical Social Work is seeking post-discharge placement for this patient at the following level of care:   SKILLED NURSING   (*CSW will update this form in Epic as items are completed)   07/12/2012  Patient/family provided with [facility] Department of Clinical Social Work's list of facilities offering this level of care within the geographic area requested by the patient (or if unable, by the patient's family).  07/12/2012  Patient/family informed of their freedom to choose among providers that offer the needed level of care, that participate in Medicare, Medicaid or managed care program needed by the patient, have an available bed and are willing to accept the patient.  07/12/2012  Patient/family informed of [facility]' ownership interest in [facility], as well as of the fact that they are under no obligation to receive care at this facility.  PASARR submitted to EDS on 07/12/2012 PASARR number received from EDS on 07/13/12  FL2 transmitted to all facilities in geographic area requested by pt/family on  07/12/2012 FL2 transmitted to all facilities within larger geographic area on   Patient informed that his/her managed care company has contracts with or will negotiate with  certain facilities, including the following:   [facility]-     Patient/family informed of bed offers received: 07/13/12 Patient chooses bed at [facility] Physician recommends and patient chooses bed at    Patient to be transferred to [facility] on  07/14/12 Patient to be transferred to facility by Ambulance  The following physician request were entered in Epic:   Additional Comments: Patient and daughter are very pleased with d/c plan.  Notified SNF  and nursing of d/c. Lorri Frederick. [place]  [phone]

## 2012-07-13 NOTE — Progress Notes (Signed)
Pt stable - was walking in halls pwb Having some pain now Ready for snf placement - 4 weeks for pain resolution

## 2012-07-13 NOTE — Progress Notes (Signed)
Subjective: Patient seen and examined, admitted for pelvic fracture. Patient says she is not in pain at this time. Inpatient rehab has been consulted. Ortho has seen the patient and recommended mobilization with physical therapy.  Objective: Vital signs in last 24 hours: Temp:  [97.8 F (36.6 C)-98.5 F (36.9 C)] 97.8 F (36.6 C) (08/20 0559) Pulse Rate:  [71-88] 71  (08/20 0559) Resp:  [18] 18  (08/20 0559) BP: (110-126)/(50-51) 126/51 mmHg (08/20 0559) SpO2:  [96 %-97 %] 97 % (08/20 0559) Weight change:  Last BM Date: 07/11/12  Intake/Output from previous day: 08/19 0701 - 08/20 0700 In: 480 [P.O.:480] Out: -  Total I/O In: 240 [P.O.:240] Out: -    Physical Exam: Head: Normocephalic, atraumatic.  Eyes: No signs of jaundice, EOMI Nose: Mucous membranes dry.  Neck: supple,No deformities, masses, or tenderness noted. Lungs: Normal respiratory effort. B/L Clear to auscultation, no crackles or wheezes.  Heart: Regular RR. S1 and S2 normal  Abdomen: BS normoactive. Soft, Nondistended, non-tender.  Extremities: No pretibial edema, no erythema   Lab Results: Basic Metabolic Panel:  Basename 07/12/12 0503 07/11/12 2023  NA 137 134*  K 4.1 3.8  CL 101 98  CO2 27 26  GLUCOSE 93 129*  BUN 20 19  CREATININE 0.53 0.54  CALCIUM 9.3 9.7  MG -- --  PHOS -- --   Liver Function Tests:  Basename 07/12/12 0503 07/11/12 2023  AST 16 21  ALT 22 28  ALKPHOS 278* 303*  BILITOT 0.4 0.4  PROT 5.7* 6.3  ALBUMIN 3.1* 3.4*   No results found for this basename: LIPASE:2,AMYLASE:2 in the last 72 hours No results found for this basename: AMMONIA:2 in the last 72 hours CBC:  Basename 07/12/12 0503 07/11/12 2023  WBC 12.8* 14.5*  NEUTROABS -- 9.8*  HGB 12.5 13.0  HCT 37.5 38.3  MCV 95.7 96.0  PLT 320 323   Urinalysis:  Basename 07/12/12 0518  COLORURINE YELLOW  LABSPEC 1.017  PHURINE 6.0  GLUCOSEU NEGATIVE  HGBUR NEGATIVE  BILIRUBINUR NEGATIVE  KETONESUR NEGATIVE    PROTEINUR NEGATIVE  UROBILINOGEN 1.0  NITRITE NEGATIVE  LEUKOCYTESUR SMALL*    No results found for this or any previous visit (from the past 240 hour(s)).  Studies/Results: Dg Pelvis 1-2 Views  07/11/2012  *RADIOLOGY REPORT*  Clinical Data: Pain since a fall 5 weeks ago.  PELVIS - 1-2 VIEW  Comparison: None.  Findings: The patient has a acute or subacute left superior pubic ramus and left parasymphyseal fractures.  Although not well seen, there is abnormal sclerosis in the left sacral ala compatible with fracture, age indeterminate.  The hips are located.  Except as noted, no fracture is identified.  IMPRESSION: Acute or subacute left superior pubic ramus and parasymphyseal pubic fractures.  Likely left sacral ala fracture is age indeterminate.  Original Report Authenticated By: Bernadene Bell. Maricela Curet, M.D.   Dg Chest Port 1 View  07/12/2012  *RADIOLOGY REPORT*  Clinical Data: Pelvic bone fractures.  PORTABLE CHEST - 1 VIEW  Comparison: 11/04/2011  Findings: There are multiple bilateral rib fractures.  Many of the fractures appear new since the prior examination but may be subacute due to the evidence of callus formation.  Patchy densities in the periphery the right lung may be related to the healing rib fractures.  Heart and mediastinum are within normal limits.  There is evidence for a hiatal hernia.  No evidence for a pneumothorax.  IMPRESSION: Bilateral rib fractures.  Suspect that many of these fractures  are at least subacute since there is evidence for callus formation.  No evidence for a pneumothorax.  Hiatal hernia.  Limited evaluation for subtle parenchymal disease due to the rib fractures but no significant airspace disease.   Original Report Authenticated By: Richarda Overlie, M.D. ( 07/12/2012 08:16:44 )     Medications: Scheduled Meds:    . diltiazem  240 mg Oral Daily  . DULoxetine  60 mg Oral Daily  . enoxaparin (LOVENOX) injection  40 mg Subcutaneous Daily  . ferrous sulfate  325 mg  Oral Q breakfast  . gabapentin  100 mg Oral BID  . sodium chloride  3 mL Intravenous Q12H  . vitamin B-12  1,000 mcg Oral Daily   Continuous Infusions:  PRN Meds:.acetaminophen, acetaminophen, hydrALAZINE, HYDROmorphone (DILAUDID) injection, ondansetron (ZOFRAN) IV, ondansetron, oxyCODONE, traMADol  Assessment/Plan:  Principal Problem:  *Pelvic fracture Active Problems:  Paroxysmal a-fib  H/O: depression  Pelvic fracture Continue with pain control with Oxycodone and Tramadol prn Ortho consulted, patient to follow up Dr August Saucer in 2 weeks WBAT, patient to go for rehab , inpatient vs SNF  Rib fracture Pain management  Paroxysmal atrial Fibrillation Continue with Diltiazem  Depression Continue Cymbalta  Elevated Alk Phos Bili is normal Secondary to pelvic fracture.  DVT prophylaxis Lovenox   LOS: 2 days   [facility] Pager: [phone] 07/13/2012, 1:10 PM

## 2012-07-13 NOTE — Progress Notes (Signed)
Physical Therapy Treatment Patient Details Name: Beth Ramos MRN: [id number] DOB: 1923-09-16 Today's Date: 07/13/2012 Time: 4098-1191 PT Time Calculation (min): 27 min  PT Assessment / Plan / Recommendation Comments on Treatment Session  Pt able to tolerate sitting up in chair this session as well as increase in ambulation distance.  However pt continues to be limited by pain as well as reporting dizziness with ambulation.  Recorded orthostatics:  sitting 135/71, standing 108/60, after 30' ambulation 121/63.      Follow Up Recommendations  Supervision/Assistance - 24 hour;Inpatient Rehab    Barriers to Discharge        Equipment Recommendations  None recommended by PT    Recommendations for Other Services Rehab consult;OT consult  Frequency Min 5X/week   Plan Discharge plan remains appropriate;Frequency remains appropriate    Precautions / Restrictions Precautions Precautions: None Restrictions Weight Bearing Restrictions: Yes RLE Weight Bearing: Weight bearing as tolerated LLE Weight Bearing: Partial weight bearing LLE Partial Weight Bearing Percentage or Pounds: 75   Pertinent Vitals/Pain 8/10 back, hip pain with ambulation     Mobility  Bed Mobility Bed Mobility: Supine to Sit Supine to Sit: 4: Min guard Details for Bed Mobility Assistance: Minguard for safety with use of rails and VCs for technique. Transfers Transfers: Sit to Stand;Stand to Sit Sit to Stand: 4: Min assist;From bed Stand to Sit: 4: Min assist;To chair/3-in-1 Details for Transfer Assistance: (A) to initiate transfer and slowly descend slowly to recliner. Max cues for hand placement Ambulation/Gait Ambulation/Gait Assistance: 4: Min assist Ambulation Distance (Feet): 150 Feet Assistive device: Rolling walker Ambulation/Gait Assistance Details: (A) to maintain balance with max cues for RW placement.  Pt reported dizziness and need to sit ~30' of ambulation.   Gait Pattern: Step-to pattern;Decreased  step length - right;Decreased step length - left;Shuffle;Trunk flexed;Decreased trunk rotation    Exercises     PT Diagnosis:    PT Problem List:   PT Treatment Interventions:     PT Goals Acute Rehab PT Goals PT Goal Formulation: With patient Time For Goal Achievement: 07/19/12 Potential to Achieve Goals: Good Pt will go Supine/Side to Sit: with modified independence PT Goal: Supine/Side to Sit - Progress: Progressing toward goal Pt will go Sit to Supine/Side: with modified independence PT Goal: Sit to Supine/Side - Progress: Progressing toward goal Pt will go Sit to Stand: with modified independence PT Goal: Sit to Stand - Progress: Progressing toward goal Pt will go Stand to Sit: with modified independence PT Goal: Stand to Sit - Progress: Progressing toward goal Pt will Ambulate: 51 - 150 feet;with modified independence;with rolling walker PT Goal: Ambulate - Progress: Progressing toward goal  Visit Information  Last PT Received On: 07/13/12 Assistance Needed: +1    Subjective Data  Subjective: "I feeling a little better today." Patient Stated Goal: To go to inpatient rehab if possibl.e   Cognition  Overall Cognitive Status: Appears within functional limits for tasks assessed/performed Arousal/Alertness: Awake/alert Orientation Level: Appears intact for tasks assessed Behavior During Session: [facility] for tasks performed    Balance     End of Session PT - End of Session Equipment Utilized During Treatment: Gait belt Activity Tolerance: Patient limited by pain Patient left: in chair;with call bell/phone within reach Nurse Communication: Mobility status   GP     Keziyah Kneale 07/13/2012, 1:15 PM Jake Shark, PT DPT [phone]

## 2012-07-13 NOTE — Progress Notes (Signed)
UR COMPLETED  

## 2012-07-14 DIAGNOSIS — R5381 Other malaise: Secondary | ICD-10-CM

## 2012-07-14 MED ORDER — OXYCODONE HCL 5 MG PO TABS: 5.0000 mg | ORAL_TABLET | Freq: Four times a day (QID) | ORAL | Status: AC | PRN

## 2012-07-14 MED ORDER — ACETAMINOPHEN 325 MG PO TABS: 650.0000 mg | ORAL_TABLET | Freq: Four times a day (QID) | ORAL | Status: AC | PRN

## 2012-07-14 NOTE — Progress Notes (Signed)
Pt seen by Dr. Riley Kill for Rehab Consult.  He notes pt does not meet medical necessity for Inpt Rehab services.  He recommends SNF vs HH. [phone]

## 2012-07-14 NOTE — Discharge Summary (Signed)
Physician Discharge Summary  COLBI SCHILTZ ZOX:[id number] DOB: 06/16/1923 DOA: 07/11/2012  PCP: Ginette Otto, MD  Admit date: 07/11/2012 Discharge date: 07/14/2012  Recommendations for Outpatient Follow-up:  1. Pt will need to follow up with PCP in 2-3 weeks post discharge 2. Please obtain BMP to evaluate electrolytes and kidney function 3. Please also check CBC to evaluate Hg and Hct levels 4. Followup with Dr. August Saucer, orthopedics in 2 weeks  Discharge Diagnoses:  Principal Problem:  *Pelvic fracture Active Problems:  Paroxysmal a-fib  H/O: depression   Discharge Condition: good    Disposition:  Skilled nursing facility for PT/OT  Diet: cardiac Wt Readings from Last 3 Encounters:  11/07/11 53.9 kg (118 lb 13.3 oz)  11/07/11 53.9 kg (118 lb 13.3 oz)    History of present illness:  78 year old female with history of paroxysmal atrial fibrillation, non-Hodgkin's lymphoma in remission, and depression missed a step and fell backwards approximately 5 weeks ago. She has had rib pain as well as left hip pain. One week ago, x-rays in the emergency department did not reveal any fracture. Because of continued pain she presented to the emergency department on August 19. X-ray showed acute/subacute fracture of the left superior pubic ramus and parasymphyseal pubic fracture.  Hospital Course:  The patient was admitted for pain control. This was well-controlled with Roxicodone and tramadol. Orthopedics was consulted. Dr. August Saucer saw the patient, and recommended mobilization with physical therapy as tolerated. He will follow up with her in 2 weeks. No surgical intervention was necessary. Inpatient physical rehabilitation was consulted. She was not accepted. The patient tolerated physical therapy well. The patient and her daughter agreed for a skilled nursing facility. The patient's atrial fibrillation was rate controlled with diltiazem 240 mg daily. The patient remained on her home medications of  Cymbalta and gabapentin. The patient's vitals remained stable. Her white blood cell count decreased. There were no signs or symptoms of infection or sepsis. She will be discharged to the skilled nursing facility with Roxicodone and tramadol for pain.  Discharge Exam: Filed Vitals:   07/14/12 0525  BP: 114/62  Pulse: 78  Temp: 98.1 F (36.7 C)  Resp: 16   Filed Vitals:   07/13/12 0559 07/13/12 1300 07/13/12 2031 07/14/12 0525  BP: 126/51 128/55 137/69 114/62  Pulse: 71 95 98 78  Temp: 97.8 F (36.6 C) 97.8 F (36.6 C) 97.5 F (36.4 C) 98.1 F (36.7 C)  TempSrc:   Oral Oral  Resp: 18 18 16 16   SpO2: 97% 98% 96% 97%   General: Alert and oriented x3. No apparent distress. Cardiovascular: Regular rate and rhythm. No rubs gallops murmurs Respiratory: Clear to auscultation. No wheezes or rhonchi Abdomen: Soft nontender. There are bowel sounds. No hepatomegaly.  Discharge Instructions  Discharge Orders    Future Orders Please Complete By Expires   Diet - low sodium heart healthy      Increase activity slowly      Comments:   WBAT     Medication List  As of 07/14/2012  9:55 AM   STOP taking these medications         HYDROcodone-acetaminophen 5-325 MG per tablet      meloxicam 7.5 MG tablet         TAKE these medications         acetaminophen 325 MG tablet   Commonly known as: TYLENOL   Take 2 tablets (650 mg total) by mouth every 6 (six) hours as needed (or Fever >/= 101).  DULoxetine 60 MG capsule   Commonly known as: CYMBALTA   Take 60 mg by mouth daily.      ferrous sulfate 325 (65 FE) MG tablet   Take 325 mg by mouth daily with breakfast.      gabapentin 100 MG capsule   Commonly known as: NEURONTIN   Take 100 mg by mouth 2 (two) times daily.      MATZIM LA 240 MG 24 hr tablet   Generic drug: diltiazem   Take 240 mg by mouth daily.      oxyCODONE 5 MG immediate release tablet   Commonly known as: Oxy IR/ROXICODONE   Take 1 tablet (5 mg total) by  mouth every 6 (six) hours as needed.      Salonpas Pads   Apply 1 each topically daily as needed. Back pain      traMADol 50 MG tablet   Commonly known as: ULTRAM   Take 50 mg by mouth every 4 (four) hours as needed. For pain      vitamin B-12 1000 MCG tablet   Commonly known as: CYANOCOBALAMIN   Take 1,000 mcg by mouth daily.      vitamin C 500 MG tablet   Commonly known as: ASCORBIC ACID   Take 500 mg by mouth daily.             The results of significant diagnostics from this hospitalization (including imaging, microbiology, ancillary and laboratory) are listed below for reference.    Significant Diagnostic Studies: Dg Lumbar Spine Complete  07/05/2012  *RADIOLOGY REPORT*  Clinical Data: Chronic low back pain  LUMBAR SPINE - COMPLETE 4+ VIEW  Comparison: 01/20/2011 and 10/25/2009  Findings: Five views of the lumbar spine submitted.  No acute fracture or subluxation.  There is mild dextroscoliosis of the lumbar spine.  Again noted about 2.5 mm anterolisthesis L4 on L5 vertebral body.  There is again noted disc space flattening at L4- L5 and L5 S1 level.  Stable mild disc space flattening at L3-L4 level.  IMPRESSION: No acute fracture or subluxation.  Stable degenerative changes as described above.  Mild dextroscoliosis.  Original Report Authenticated By: Natasha Mead, M.D.   Dg Pelvis 1-2 Views  07/11/2012  *RADIOLOGY REPORT*  Clinical Data: Pain since a fall 5 weeks ago.  PELVIS - 1-2 VIEW  Comparison: None.  Findings: The patient has a acute or subacute left superior pubic ramus and left parasymphyseal fractures.  Although not well seen, there is abnormal sclerosis in the left sacral ala compatible with fracture, age indeterminate.  The hips are located.  Except as noted, no fracture is identified.  IMPRESSION: Acute or subacute left superior pubic ramus and parasymphyseal pubic fractures.  Likely left sacral ala fracture is age indeterminate.  Original Report Authenticated By: Bernadene Bell. Maricela Curet, M.D.   Dg Chest Port 1 View  07/12/2012  *RADIOLOGY REPORT*  Clinical Data: Pelvic bone fractures.  PORTABLE CHEST - 1 VIEW  Comparison: 11/04/2011  Findings: There are multiple bilateral rib fractures.  Many of the fractures appear new since the prior examination but may be subacute due to the evidence of callus formation.  Patchy densities in the periphery the right lung may be related to the healing rib fractures.  Heart and mediastinum are within normal limits.  There is evidence for a hiatal hernia.  No evidence for a pneumothorax.  IMPRESSION: Bilateral rib fractures.  Suspect that many of these fractures are at least subacute since there is evidence for  callus formation.  No evidence for a pneumothorax.  Hiatal hernia.  Limited evaluation for subtle parenchymal disease due to the rib fractures but no significant airspace disease.   Original Report Authenticated By: Richarda Overlie, M.D. ( 07/12/2012 08:16:44 )      Microbiology: No results found for this or any previous visit (from the past 240 hour(s)).   Labs: Basic Metabolic Panel:  Lab 07/12/12 1610 07/11/12 2023  NA 137 134*  K 4.1 3.8  CL 101 98  CO2 27 26  GLUCOSE 93 129*  BUN 20 19  CREATININE 0.53 0.54  CALCIUM 9.3 9.7  MG -- --  PHOS -- --   Liver Function Tests:  Lab 07/12/12 0503 07/11/12 2023  AST 16 21  ALT 22 28  ALKPHOS 278* 303*  BILITOT 0.4 0.4  PROT 5.7* 6.3  ALBUMIN 3.1* 3.4*   No results found for this basename: LIPASE:5,AMYLASE:5 in the last 168 hours No results found for this basename: AMMONIA:5 in the last 168 hours CBC:  Lab 07/12/12 0503 07/11/12 2023  WBC 12.8* 14.5*  NEUTROABS -- 9.8*  HGB 12.5 13.0  HCT 37.5 38.3  MCV 95.7 96.0  PLT 320 323   Cardiac Enzymes: No results found for this basename: CKTOTAL:5,CKMB:5,CKMBINDEX:5,TROPONINI:5 in the last 168 hours BNP: No components found with this basename: POCBNP:5 CBG: No results found for this basename: GLUCAP:5 in the last  168 hours  Time coordinating discharge:  Greater than 30 minutes  Signed:  Suyash Amory, DO [facility] Pager: [phone] 07/14/2012, 9:55 AM

## 2012-07-14 NOTE — Progress Notes (Signed)
Physical Therapy Treatment Patient Details Name: Beth Ramos MRN: [id number] DOB: 02-21-1923 Today's Date: 07/14/2012 Time: 1478-2956 PT Time Calculation (min): 28 min  PT Assessment / Plan / Recommendation Comments on Treatment Session  Pt very limited due to pain this session.  Pt only able to ambulate to restroom and needed to return to supine.  Order donut hole for comfort.    Follow Up Recommendations  Skilled nursing facility    Barriers to Discharge        Equipment Recommendations  None recommended by PT    Recommendations for Other Services    Frequency Min 5X/week   Plan Discharge plan remains appropriate;Frequency remains appropriate    Precautions / Restrictions Restrictions Weight Bearing Restrictions: Yes RLE Weight Bearing: Weight bearing as tolerated LLE Weight Bearing: Partial weight bearing LLE Partial Weight Bearing Percentage or Pounds: 75   Pertinent Vitals/Pain 8-9/10 pelvic pain    Mobility  Bed Mobility Bed Mobility: Supine to Sit Supine to Sit: 4: Min guard Sit to Supine: 4: Min assist Details for Bed Mobility Assistance: min (A) to slowly descend trunk to bed Transfers Transfers: Sit to Stand;Stand to Sit Sit to Stand: 4: Min assist;From bed;From chair/3-in-1 Stand to Sit: 4: Min assist;To bed;To chair/3-in-1 Details for Transfer Assistance: (A) to initiate transfer and slowly descend slowly to recliner. Max cues for hand placement Ambulation/Gait Ambulation/Gait Assistance: 4: Min assist Ambulation Distance (Feet): 40 Feet Assistive device: Rolling walker Ambulation/Gait Assistance Details: (A) to manage RW and maintain balance with cues for RW placement. Gait Pattern: Step-to pattern;Decreased step length - right;Decreased step length - left;Shuffle;Trunk flexed;Decreased trunk rotation    Exercises     PT Diagnosis:    PT Problem List:   PT Treatment Interventions:     PT Goals Acute Rehab PT Goals PT Goal Formulation: With  patient Time For Goal Achievement: 07/19/12 Potential to Achieve Goals: Good Pt will go Supine/Side to Sit: with modified independence PT Goal: Supine/Side to Sit - Progress: Not met Pt will go Sit to Supine/Side: with modified independence PT Goal: Sit to Supine/Side - Progress: Not met Pt will go Sit to Stand: with modified independence PT Goal: Sit to Stand - Progress: Not met Pt will go Stand to Sit: with modified independence PT Goal: Stand to Sit - Progress: Not met Pt will Ambulate: 51 - 150 feet;with modified independence;with rolling walker PT Goal: Ambulate - Progress: Not met  Visit Information  Last PT Received On: 07/14/12 Assistance Needed: +1    Subjective Data  Subjective: "I don't think I can do anymore." Patient Stated Goal: To go to inpatient rehab if possibl.e   Cognition  Overall Cognitive Status: Appears within functional limits for tasks assessed/performed Arousal/Alertness: Awake/alert Orientation Level: Appears intact for tasks assessed Behavior During Session: [facility] for tasks performed    Balance     End of Session PT - End of Session Equipment Utilized During Treatment: Gait belt Activity Tolerance: Patient limited by pain Patient left: in bed;with call bell/phone within reach Nurse Communication: Mobility status   GP     Litsy Epting 07/14/2012, 2:25 PM Jake Shark, PT DPT [phone]

## 2013-03-01 ENCOUNTER — Other Ambulatory Visit: Payer: Self-pay | Admitting: Geriatric Medicine

## 2013-03-01 DIAGNOSIS — R4702 Dysphasia: Secondary | ICD-10-CM

## 2013-03-03 ENCOUNTER — Ambulatory Visit
Admission: RE | Admit: 2013-03-03 | Discharge: 2013-03-03 | Disposition: A | Discharge status: Home / Self Care | Payer: Medicare Other | Source: Ambulatory Visit | Attending: Geriatric Medicine | Admitting: Geriatric Medicine

## 2013-03-03 DIAGNOSIS — R4702 Dysphasia: Secondary | ICD-10-CM

## 2014-05-17 ENCOUNTER — Encounter: Payer: Self-pay | Admitting: *Deleted

## 2014-06-27 ENCOUNTER — Ambulatory Visit: Payer: Medicare Other | Admitting: Physical Therapy

## 2015-02-12 ENCOUNTER — Ambulatory Visit
Admission: RE | Admit: 2015-02-12 | Discharge: 2015-02-12 | Disposition: A | Discharge status: Home / Self Care | Payer: Commercial Managed Care - HMO | Source: Ambulatory Visit | Attending: Geriatric Medicine | Admitting: Geriatric Medicine

## 2015-02-12 ENCOUNTER — Other Ambulatory Visit: Payer: Self-pay | Admitting: Geriatric Medicine

## 2015-02-12 DIAGNOSIS — M545 Low back pain: Secondary | ICD-10-CM

## 2015-12-17 DIAGNOSIS — C8280 Other types of follicular lymphoma, unspecified site: Secondary | ICD-10-CM | POA: Diagnosis not present

## 2015-12-17 DIAGNOSIS — R413 Other amnesia: Secondary | ICD-10-CM | POA: Diagnosis not present

## 2015-12-17 DIAGNOSIS — Z79899 Other long term (current) drug therapy: Secondary | ICD-10-CM | POA: Diagnosis not present

## 2015-12-17 DIAGNOSIS — F0390 Unspecified dementia without behavioral disturbance: Secondary | ICD-10-CM | POA: Diagnosis not present

## 2015-12-17 DIAGNOSIS — I480 Paroxysmal atrial fibrillation: Secondary | ICD-10-CM | POA: Diagnosis not present

## 2015-12-27 ENCOUNTER — Other Ambulatory Visit: Payer: Self-pay | Admitting: Geriatric Medicine

## 2015-12-27 DIAGNOSIS — R413 Other amnesia: Secondary | ICD-10-CM

## 2016-01-04 ENCOUNTER — Other Ambulatory Visit: Payer: Commercial Managed Care - HMO

## 2016-01-07 ENCOUNTER — Ambulatory Visit
Admission: RE | Admit: 2016-01-07 | Discharge: 2016-01-07 | Disposition: A | Discharge status: Home / Self Care | Payer: PPO | Source: Ambulatory Visit | Attending: Geriatric Medicine | Admitting: Geriatric Medicine

## 2016-01-07 DIAGNOSIS — R413 Other amnesia: Secondary | ICD-10-CM

## 2016-01-14 DIAGNOSIS — G301 Alzheimer's disease with late onset: Secondary | ICD-10-CM | POA: Diagnosis not present

## 2016-02-17 ENCOUNTER — Emergency Department (HOSPITAL_COMMUNITY): Payer: PPO

## 2016-02-17 ENCOUNTER — Encounter (HOSPITAL_COMMUNITY): Payer: Self-pay | Admitting: Emergency Medicine

## 2016-02-17 ENCOUNTER — Emergency Department (HOSPITAL_COMMUNITY)
Admission: EM | Admit: 2016-02-17 | Discharge: 2016-02-17 | Disposition: A | Discharge status: Home / Self Care | Payer: PPO | Attending: Emergency Medicine | Admitting: Emergency Medicine

## 2016-02-17 DIAGNOSIS — Y9289 Other specified places as the place of occurrence of the external cause: Secondary | ICD-10-CM | POA: Insufficient documentation

## 2016-02-17 DIAGNOSIS — S7001XA Contusion of right hip, initial encounter: Secondary | ICD-10-CM | POA: Diagnosis not present

## 2016-02-17 DIAGNOSIS — D649 Anemia, unspecified: Secondary | ICD-10-CM | POA: Insufficient documentation

## 2016-02-17 DIAGNOSIS — S5001XA Contusion of right elbow, initial encounter: Secondary | ICD-10-CM | POA: Insufficient documentation

## 2016-02-17 DIAGNOSIS — W1839XA Other fall on same level, initial encounter: Secondary | ICD-10-CM | POA: Diagnosis not present

## 2016-02-17 DIAGNOSIS — S50311A Abrasion of right elbow, initial encounter: Secondary | ICD-10-CM

## 2016-02-17 DIAGNOSIS — F329 Major depressive disorder, single episode, unspecified: Secondary | ICD-10-CM | POA: Insufficient documentation

## 2016-02-17 DIAGNOSIS — Y998 Other external cause status: Secondary | ICD-10-CM | POA: Diagnosis not present

## 2016-02-17 DIAGNOSIS — F419 Anxiety disorder, unspecified: Secondary | ICD-10-CM | POA: Insufficient documentation

## 2016-02-17 DIAGNOSIS — Z79899 Other long term (current) drug therapy: Secondary | ICD-10-CM | POA: Diagnosis not present

## 2016-02-17 DIAGNOSIS — Y9389 Activity, other specified: Secondary | ICD-10-CM | POA: Diagnosis not present

## 2016-02-17 DIAGNOSIS — T148 Other injury of unspecified body region: Secondary | ICD-10-CM | POA: Diagnosis not present

## 2016-02-17 DIAGNOSIS — Z8579 Personal history of other malignant neoplasms of lymphoid, hematopoietic and related tissues: Secondary | ICD-10-CM | POA: Diagnosis not present

## 2016-02-17 DIAGNOSIS — S3992XA Unspecified injury of lower back, initial encounter: Secondary | ICD-10-CM | POA: Insufficient documentation

## 2016-02-17 DIAGNOSIS — M25551 Pain in right hip: Secondary | ICD-10-CM | POA: Diagnosis not present

## 2016-02-17 DIAGNOSIS — S60221A Contusion of right hand, initial encounter: Secondary | ICD-10-CM | POA: Diagnosis not present

## 2016-02-17 DIAGNOSIS — S79911A Unspecified injury of right hip, initial encounter: Secondary | ICD-10-CM | POA: Diagnosis not present

## 2016-02-17 DIAGNOSIS — W19XXXA Unspecified fall, initial encounter: Secondary | ICD-10-CM

## 2016-02-17 NOTE — Discharge Instructions (Signed)
Read the information below.  You may return to the Emergency Department at any time for worsening condition or any new symptoms that concern you.  If you develop uncontrolled pain, weakness or numbness of the extremity, severe discoloration of the skin, or you are unable to walk or move your leg, return to the ER for a recheck.      Contusion A contusion is a deep bruise. Contusions are the result of a blunt injury to tissues and muscle fibers under the skin. The injury causes bleeding under the skin. The skin overlying the contusion may turn blue, purple, or yellow. Minor injuries will give you a painless contusion, but more severe contusions may stay painful and swollen for a few weeks.  CAUSES  This condition is usually caused by a blow, trauma, or direct force to an area of the body. SYMPTOMS  Symptoms of this condition include:  Swelling of the injured area.  Pain and tenderness in the injured area.  Discoloration. The area may have redness and then turn blue, purple, or yellow. DIAGNOSIS  This condition is diagnosed based on a physical exam and medical history. An X-ray, CT scan, or MRI may be needed to determine if there are any associated injuries, such as broken bones (fractures). TREATMENT  Specific treatment for this condition depends on what area of the body was injured. In general, the best treatment for a contusion is resting, icing, applying pressure to (compression), and elevating the injured area. This is often called the RICE strategy. Over-the-counter anti-inflammatory medicines may also be recommended for pain control.  HOME CARE INSTRUCTIONS   Rest the injured area.  If directed, apply ice to the injured area:  Put ice in a plastic bag.  Place a towel between your skin and the bag.  Leave the ice on for 20 minutes, 2-3 times per day.  If directed, apply light compression to the injured area using an elastic bandage. Make sure the bandage is not wrapped too tightly.  Remove and reapply the bandage as directed by your health care provider.  If possible, raise (elevate) the injured area above the level of your heart while you are sitting or lying down.  Take over-the-counter and prescription medicines only as told by your health care provider. SEEK MEDICAL CARE IF:  Your symptoms do not improve after several days of treatment.  Your symptoms get worse.  You have difficulty moving the injured area. SEEK IMMEDIATE MEDICAL CARE IF:   You have severe pain.  You have numbness in a hand or foot.  Your hand or foot turns pale or cold.   This information is not intended to replace advice given to you by your health care provider. Make sure you discuss any questions you have with your health care provider.   Document Released: 08/20/2005 Document Revised: 08/01/2015 Document Reviewed: 03/28/2015 Elsevier Interactive Patient Education 2016 [place] An abrasion is a cut or scrape on the outer surface of your skin. An abrasion does not extend through all of the layers of your skin. It is important to care for your abrasion properly to prevent infection. CAUSES Most abrasions are caused by falling on or gliding across the ground or another surface. When your skin rubs on something, the outer and inner layer of skin rubs off.  SYMPTOMS A cut or scrape is the main symptom of this condition. The scrape may be bleeding, or it may appear red or pink. If there was an associated fall, there  may be an underlying bruise. DIAGNOSIS An abrasion is diagnosed with a physical exam. TREATMENT Treatment for this condition depends on how large and deep the abrasion is. Usually, your abrasion will be cleaned with water and mild soap. This removes any dirt or debris that may be stuck. An antibiotic ointment may be applied to the abrasion to help prevent infection. A bandage (dressing) may be placed on the abrasion to keep it clean. You may also need a tetanus  shot. HOME CARE INSTRUCTIONS Medicines  Take or apply medicines only as directed by your health care provider.  If you were prescribed an antibiotic ointment, finish all of it even if you start to feel better. Wound Care  Clean the wound with mild soap and water 2-3 times per day or as directed by your health care provider. Pat your wound dry with a clean towel. Do not rub it.  There are many different ways to close and cover a wound. Follow instructions from your health care provider about:  Wound care.  Dressing changes and removal.  Check your wound every day for signs of infection. Watch for:  Redness, swelling, or pain.  Fluid, blood, or pus. General Instructions  Keep the dressing dry as directed by your health care provider. Do not take baths, swim, use a hot tub, or do anything that would put your wound underwater until your health care provider approves.  If there is swelling, raise (elevate) the injured area above the level of your heart while you are sitting or lying down.  Keep all follow-up visits as directed by your health care provider. This is important. SEEK MEDICAL CARE IF:  You received a tetanus shot and you have swelling, severe pain, redness, or bleeding at the injection site.  Your pain is not controlled with medicine.  You have increased redness, swelling, or pain at the site of your wound. SEEK IMMEDIATE MEDICAL CARE IF:  You have a red streak going away from your wound.  You have a fever.  You have fluid, blood, or pus coming from your wound.  You notice a bad smell coming from your wound or your dressing.   This information is not intended to replace advice given to you by your health care provider. Make sure you discuss any questions you have with your health care provider.   Document Released: 08/20/2005 Document Revised: 08/01/2015 Document Reviewed: 11/08/2014 Elsevier Interactive Patient Education [employer].

## 2016-02-17 NOTE — ED Provider Notes (Signed)
CSN: [id number]     Arrival date & time 02/17/16  1217 History   First MD Initiated Contact with Patient 02/17/16 1241     Chief Complaint  Patient presents with  . Fall  . Hip Injury     (Consider location/radiation/quality/duration/timing/severity/associated sxs/prior Treatment) HPI   Pt who walks with a walker normally woke up this morning, swung her feet out of bed, put her house slippers on and stood up to grab her walker but it was a little out of her reach.  As she attempted to grab it she fell onto her right side.  Reports mild soreness in her lower back and some tenderness when touching the swollen area over her right hip.  Otherwise denies any pain.  Denies hitting head or LOC.  Denies lightheadedness, dizziness, headache, neck pain, CP, abdominal pain, SOB, vomiting, weakness or numbness of the extremities.    She lives alone at home, reports being very healthy for her age.  Pt reports tetanux vx is up to date.    Past Medical History  Diagnosis Date  . Depression   . PONV (postoperative nausea and vomiting) 1960  . Anemia   . Lymphoma ([place])   . Anxiety    Past Surgical History  Procedure Laterality Date  . Abdominal hysterectomy  1960    partial  . Bilateral oophorectomy  1962  . Appendectomy  1948  . Cataract extraction, bilateral    . Esophagogastroduodenoscopy  11/06/2011    Procedure: ESOPHAGOGASTRODUODENOSCOPY (EGD);  Surgeon: Jeryl Columbia, MD;  Location: [facility];  Service: Endoscopy;  Laterality: N/A;   No family history on file. Social History  Substance Use Topics  . Smoking status: Never Smoker   . Smokeless tobacco: Never Used  . Alcohol Use: No   OB History    No data available     Review of Systems  All other systems reviewed and are negative.     Allergies  Sulfa antibiotics  Home Medications   Prior to Admission medications   Medication Sig Start Date End Date Taking? Authorizing Provider  diltiazem (MATZIM LA) 240 MG 24 hr  tablet Take 240 mg by mouth daily.    Historical Provider, MD  DULoxetine (CYMBALTA) 60 MG capsule Take 60 mg by mouth daily.      Historical Provider, MD  ferrous sulfate 325 (65 FE) MG tablet Take 325 mg by mouth daily with breakfast.      Historical Provider, MD  gabapentin (NEURONTIN) 100 MG capsule Take 100 mg by mouth 2 (two) times daily.      Historical Provider, MD  Liniments Elwin Mocha) PADS Apply 1 each topically daily as needed. Back pain    Historical Provider, MD  traMADol (ULTRAM) 50 MG tablet Take 50 mg by mouth every 4 (four) hours as needed. For pain    Historical Provider, MD  vitamin B-12 (CYANOCOBALAMIN) 1000 MCG tablet Take 1,000 mcg by mouth daily.      Historical Provider, MD  vitamin C (ASCORBIC ACID) 500 MG tablet Take 500 mg by mouth daily.      Historical Provider, MD   BP 121/76 mmHg  Pulse 87  Temp(Src) 97.8 F (36.6 C) (Oral)  Resp 18  Ht 5' (1.524 m)  Wt 44.453 kg  BMI 19.14 kg/m2  SpO2 99% Physical Exam  Constitutional: She appears well-developed and well-nourished. No distress.  HENT:  Head: Normocephalic and atraumatic.  Neck: Neck supple.  Cardiovascular: Normal rate and regular rhythm.   Pulmonary/Chest:  Effort normal and breath sounds normal. No respiratory distress. She has no wheezes. She has no rales.  Abdominal: Soft. She exhibits no distension. There is no tenderness. There is no rebound and no guarding.  Musculoskeletal:  Large mobile hematoma over lateral right hip.  No focal bony tenderness throughout exam.   Spine nontender, no crepitus, or stepoffs.  Moves all extremities equally, 5/5 strength, sensation intact, distal pulses intact.  Full passive ROM of bilateral hips without pain. Right elbow with small area of ecchymosis and small abrasion - nontender throughout.   Right dorsal hand with small bruise, no break in skin.    Neurological: She is alert. She has normal strength. No cranial nerve deficit or sensory deficit. She exhibits  normal muscle tone. GCS eye subscore is 4. GCS verbal subscore is 5. GCS motor subscore is 6.  Skin: She is not diaphoretic.  Nursing note and vitals reviewed.   ED Course  Procedures (including critical care time) Labs Review Labs Reviewed - No data to display  Imaging Review Dg Hip Unilat  With Pelvis 2-3 Views Right  02/17/2016  CLINICAL DATA:  Fall, right hip pain EXAM: DG HIP (WITH OR WITHOUT PELVIS) 2-3V RIGHT COMPARISON:  None. FINDINGS: No fracture or dislocation is seen. Bilateral hip joint spaces are symmetric. Visualized bony pelvis appears intact. Mild degenerative changes of the lower lumbar spine. IMPRESSION: No fracture or dislocation is seen. Electronically Signed   By: Julian Hy M.D.   On: 02/17/2016 13:32   I have personally reviewed and evaluated these images and lab results as part of my medical decision-making.   EKG Interpretation None      MDM   Final diagnoses:  Fall, initial encounter  Hip hematoma, right, initial encounter  Abrasion of elbow, right, initial encounter   Afebrile, nontoxic patient with injury to her right hip and elbow from mechanical fall, attempting to reach for her walker this morning.  She does have large hematoma overlying right hip.  Neurovascularly intact.   Xray negative.  Doubt other significant injury or fracture, doubt intracranial bleed or injury.  Per patient's description this was a clearly mechanical fall.  Pt also seen and examined by Dr Rex Kras who agrees with no further workup and d/c home, she has discussed results and return precautions with patient and family member.  D/C home with PCP follow up, return precautions.  Discussed result, findings, treatment, and follow up  with patient.  Pt given return precautions.  Pt verbalizes understanding and agrees with plan.      Clayton Bibles, PA-C 02/17/16 [place], MD 02/18/16 [phone]

## 2016-02-17 NOTE — ED Notes (Signed)
Radiology transport to bring pt to room upon completion.

## 2016-02-17 NOTE — ED Notes (Signed)
Pt got up out of bed and attempted to walk to her walker across the room and fell. Pt seen at [facility] and was sent to the orthopedics today. Pt went to orthopedic today and was told she had to come to ED to get her hip drained.

## 2016-03-11 DIAGNOSIS — I480 Paroxysmal atrial fibrillation: Secondary | ICD-10-CM | POA: Diagnosis not present

## 2016-03-11 DIAGNOSIS — E46 Unspecified protein-calorie malnutrition: Secondary | ICD-10-CM | POA: Diagnosis not present

## 2016-03-11 DIAGNOSIS — G301 Alzheimer's disease with late onset: Secondary | ICD-10-CM | POA: Diagnosis not present

## 2016-03-18 ENCOUNTER — Encounter (HOSPITAL_COMMUNITY): Payer: Self-pay | Admitting: Emergency Medicine

## 2016-03-18 ENCOUNTER — Emergency Department (HOSPITAL_COMMUNITY): Payer: PPO

## 2016-03-18 ENCOUNTER — Emergency Department (HOSPITAL_COMMUNITY)
Admission: EM | Admit: 2016-03-18 | Discharge: 2016-03-18 | Disposition: A | Discharge status: Home / Self Care | Payer: PPO | Attending: Emergency Medicine | Admitting: Emergency Medicine

## 2016-03-18 DIAGNOSIS — Z862 Personal history of diseases of the blood and blood-forming organs and certain disorders involving the immune mechanism: Secondary | ICD-10-CM | POA: Insufficient documentation

## 2016-03-18 DIAGNOSIS — Y9389 Activity, other specified: Secondary | ICD-10-CM | POA: Insufficient documentation

## 2016-03-18 DIAGNOSIS — S20212A Contusion of left front wall of thorax, initial encounter: Secondary | ICD-10-CM | POA: Insufficient documentation

## 2016-03-18 DIAGNOSIS — Y998 Other external cause status: Secondary | ICD-10-CM | POA: Insufficient documentation

## 2016-03-18 DIAGNOSIS — R10819 Abdominal tenderness, unspecified site: Secondary | ICD-10-CM | POA: Diagnosis not present

## 2016-03-18 DIAGNOSIS — S299XXA Unspecified injury of thorax, initial encounter: Secondary | ICD-10-CM | POA: Diagnosis not present

## 2016-03-18 DIAGNOSIS — S3690XA Unspecified injury of unspecified intra-abdominal organ, initial encounter: Secondary | ICD-10-CM | POA: Diagnosis not present

## 2016-03-18 DIAGNOSIS — W1839XA Other fall on same level, initial encounter: Secondary | ICD-10-CM | POA: Diagnosis not present

## 2016-03-18 DIAGNOSIS — Y9289 Other specified places as the place of occurrence of the external cause: Secondary | ICD-10-CM | POA: Diagnosis not present

## 2016-03-18 DIAGNOSIS — F329 Major depressive disorder, single episode, unspecified: Secondary | ICD-10-CM | POA: Insufficient documentation

## 2016-03-18 DIAGNOSIS — Z8572 Personal history of non-Hodgkin lymphomas: Secondary | ICD-10-CM | POA: Insufficient documentation

## 2016-03-18 DIAGNOSIS — F419 Anxiety disorder, unspecified: Secondary | ICD-10-CM | POA: Diagnosis not present

## 2016-03-18 DIAGNOSIS — M546 Pain in thoracic spine: Secondary | ICD-10-CM | POA: Diagnosis not present

## 2016-03-18 DIAGNOSIS — S20222A Contusion of left back wall of thorax, initial encounter: Secondary | ICD-10-CM | POA: Diagnosis not present

## 2016-03-18 DIAGNOSIS — S29002A Unspecified injury of muscle and tendon of back wall of thorax, initial encounter: Secondary | ICD-10-CM | POA: Diagnosis not present

## 2016-03-18 DIAGNOSIS — Z79899 Other long term (current) drug therapy: Secondary | ICD-10-CM | POA: Diagnosis not present

## 2016-03-18 MED ORDER — TRAMADOL HCL 50 MG PO TABS
50.0000 mg | ORAL_TABLET | Freq: Once | ORAL | Status: AC
  Administered 2016-03-18: 50 mg via ORAL
  Filled 2016-03-18: qty 1

## 2016-03-18 NOTE — ED Provider Notes (Signed)
CSN: [id number]     Arrival date & time 03/18/16  1630 History   First MD Initiated Contact with Patient 03/18/16 1634     Chief Complaint  Patient presents with  . Fall     (Consider location/radiation/quality/duration/timing/severity/associated sxs/prior Treatment) HPI Comments: Patient presents with left rib pain after a fall. She states she was getting out of bed yesterday morning and her walker which she uses to ambulate was a little bit too far away. She reached for her walker and lost her balance falling over onto her left side. She states is worse with movement but not worse with breathing. She denies any shortness of breath. She denies abdominal pain. No nausea or vomiting. She says that she did not hit her head. There is no neck pain. She's had constant pain in her left upper back.  She denies any other injuries from the fall. She states she's had frequent falls over the last few months. Her daughter states that she is stubborn and doesn't always keep her walker nearby. She's been taking Aleve with tramadol for the pain and states that is controlling the pain. She also got a prescription yesterday for hydrocodone by her PCP.  Patient is a 82 y.o. female presenting with fall.  Fall Pertinent negatives include no chest pain, no abdominal pain, no headaches and no shortness of breath.    Past Medical History  Diagnosis Date  . Depression   . PONV (postoperative nausea and vomiting) 1960  . Anemia   . Lymphoma ([place])   . Anxiety    Past Surgical History  Procedure Laterality Date  . Abdominal hysterectomy  1960    partial  . Bilateral oophorectomy  1962  . Appendectomy  1948  . Cataract extraction, bilateral    . Esophagogastroduodenoscopy  11/06/2011    Procedure: ESOPHAGOGASTRODUODENOSCOPY (EGD);  Surgeon: Jeryl Columbia, MD;  Location: [facility];  Service: Endoscopy;  Laterality: N/A;   No family history on file. Social History  Substance Use Topics  . Smoking status:  Never Smoker   . Smokeless tobacco: Never Used  . Alcohol Use: No   OB History    No data available     Review of Systems  Constitutional: Negative for fever, chills, diaphoresis and fatigue.  HENT: Negative for congestion, rhinorrhea and sneezing.   Eyes: Negative.   Respiratory: Negative for cough, chest tightness and shortness of breath.   Cardiovascular: Negative for chest pain and leg swelling.  Gastrointestinal: Negative for nausea, vomiting, abdominal pain, diarrhea and blood in stool.  Genitourinary: Negative for frequency, hematuria, flank pain and difficulty urinating.  Musculoskeletal: Positive for back pain. Negative for arthralgias.  Skin: Negative for rash.  Neurological: Negative for dizziness, speech difficulty, weakness, numbness and headaches.      Allergies  Sulfa antibiotics  Home Medications   Prior to Admission medications   Medication Sig Start Date End Date Taking? Authorizing Provider  Cholecalciferol (VITAMIN D PO) Take 1 tablet by mouth daily.   Yes Historical Provider, MD  diltiazem (CARDIZEM SR) 60 MG 12 hr capsule Take 60 mg by mouth daily.   Yes Historical Provider, MD  donepezil (ARICEPT) 10 MG tablet Take 10 mg by mouth at bedtime.   Yes Historical Provider, MD  HYDROcodone-acetaminophen (NORCO/VICODIN) 5-325 MG tablet Take 1 tablet by mouth every 6 (six) hours as needed for moderate pain.   Yes Historical Provider, MD  Multiple Vitamin (MULTIVITAMIN WITH MINERALS) TABS tablet Take 1 tablet by mouth daily.  Yes Historical Provider, MD  traMADol (ULTRAM) 50 MG tablet Take 50 mg by mouth every 8 (eight) hours as needed for moderate pain.   Yes Historical Provider, MD   Pulse 88  Temp(Src) 98.2 F (36.8 C) (Oral)  Resp 16  SpO2 98% Physical Exam  Constitutional: She is oriented to person, place, and time. She appears well-developed and well-nourished.  HENT:  Head: Normocephalic and atraumatic.  Eyes: Pupils are equal, round, and reactive  to light.  Neck: Normal range of motion. Neck supple.  Cardiovascular: Normal rate, regular rhythm and normal heart sounds.   Pulmonary/Chest: Effort normal and breath sounds normal. No respiratory distress. She has no wheezes. She has no rales. She exhibits no tenderness.  Abdominal: Soft. Bowel sounds are normal. There is no tenderness. There is no rebound and no guarding.  Musculoskeletal: Normal range of motion. She exhibits no edema.  Positive tenderness to the left upper back along the posterior ribs. There is no crepitus or deformity. No evidence of external trauma. There is no pain along the cervical thoracic or lumbosacral spine. There is no pain with palpation or range of motion extremities, including the hips.  Lymphadenopathy:    She has no cervical adenopathy.  Neurological: She is alert and oriented to person, place, and time.  Skin: Skin is warm and dry. No rash noted.  Psychiatric: She has a normal mood and affect.    ED Course  Procedures (including critical care time) Labs Review Labs Reviewed - No data to display  Imaging Review Dg Ribs Unilateral W/chest Left  03/18/2016  CLINICAL DATA:  [place] from bed.  Left mid back pain. EXAM: LEFT RIBS AND CHEST - 3+ VIEW COMPARISON:  07/12/2012 FINDINGS: The bones are diffusely osteopenic. No acute displaced rib fractures are identified at this time. Chronic appearing bilateral rib deformities are noted. There is a scoliosis deformity involving the lower thoracic and upper lumbar spine. There is no evidence of pneumothorax or pleural effusion. Large hiatal hernia is noted. Both lungs are clear. Heart size and mediastinal contours are within normal limits. IMPRESSION: 1. New no acute displaced rib fractures identified at this time. Sensitivity is diminished due to osteopenia 2. Chronic appearing bilateral rib deformities 3. Hiatal hernia. Electronically Signed   By: Kerby Moors M.D.   On: 03/18/2016 17:55   I have personally reviewed  and evaluated these images and lab results as part of my medical decision-making.   EKG Interpretation None      MDM   Final diagnoses:  Rib contusion, left, initial encounter    Patient presents with left posterior rib pain after fall. There is no acute definitive rib fracture noted. There is no evidence of pneumothorax. She has no spinal tenderness or evidence of other injuries. There is no abdominal tenderness. She was discharged home in good condition. She has tramadol and hydrocodone to use at home. She states that this is controlling her pain at home. She was advised to follow-up with her PCP if her symptoms are not improving or return here as needed for any worsening symptoms.    Malvin Johns, MD 03/18/16 [id number]

## 2016-03-18 NOTE — ED Notes (Signed)
[facility] from home, mechanical fall yesterday with no LOC, c/o left sided rib pain, A/O X4 and in NAD

## 2016-03-18 NOTE — Discharge Instructions (Signed)
Chest Contusion A chest contusion is a deep bruise on your chest area. Contusions are the result of an injury that caused bleeding under the skin. A chest contusion may involve bruising of the skin, muscles, or ribs. The contusion may turn blue, purple, or yellow. Minor injuries will give you a painless contusion, but more severe contusions may stay painful and swollen for a few weeks. CAUSES  A contusion is usually caused by a blow, trauma, or direct force to an area of the body. SYMPTOMS   Swelling and redness of the injured area.  Discoloration of the injured area.  Tenderness and soreness of the injured area.  Pain. DIAGNOSIS  The diagnosis can be made by taking a history and performing a physical exam. An X-ray, CT scan, or MRI may be needed to determine if there were any associated injuries, such as broken bones (fractures) or internal injuries. TREATMENT  Often, the best treatment for a chest contusion is resting, icing, and applying cold compresses to the injured area. Deep breathing exercises may be recommended to reduce the risk of pneumonia. Over-the-counter medicines may also be recommended for pain control. HOME CARE INSTRUCTIONS   Put ice on the injured area.  Put ice in a plastic bag.  Place a towel between your skin and the bag.  Leave the ice on for 15-20 minutes, 03-04 times a day.  Only take over-the-counter or prescription medicines as directed by your caregiver. Your caregiver may recommend avoiding anti-inflammatory medicines (aspirin, ibuprofen, and naproxen) for 48 hours because these medicines may increase bruising.  Rest the injured area.  Perform deep-breathing exercises as directed by your caregiver.  Stop smoking if you smoke.  Do not lift objects over 5 pounds (2.3 kg) for 3 days or longer if recommended by your caregiver. SEEK IMMEDIATE MEDICAL CARE IF:   You have increased bruising or swelling.  You have pain that is getting worse.  You have  difficulty breathing.  You have dizziness, weakness, or fainting.  You have blood in your urine or stool.  You cough up or vomit blood.  Your swelling or pain is not relieved with medicines. MAKE SURE YOU:   Understand these instructions.  Will watch your condition.  Will get help right away if you are not doing well or get worse.   This information is not intended to replace advice given to you by your health care provider. Make sure you discuss any questions you have with your health care provider.   Document Released: 08/05/2001 Document Revised: 08/04/2012 Document Reviewed: 05/03/2012 Elsevier Interactive Patient Education 2016 [employer]  

## 2016-04-08 DIAGNOSIS — T162XXA Foreign body in left ear, initial encounter: Secondary | ICD-10-CM | POA: Diagnosis not present

## 2016-06-24 DIAGNOSIS — F325 Major depressive disorder, single episode, in full remission: Secondary | ICD-10-CM | POA: Diagnosis not present

## 2016-06-24 DIAGNOSIS — Z0000 Encounter for general adult medical examination without abnormal findings: Secondary | ICD-10-CM | POA: Diagnosis not present

## 2016-06-24 DIAGNOSIS — I480 Paroxysmal atrial fibrillation: Secondary | ICD-10-CM | POA: Diagnosis not present

## 2016-06-24 DIAGNOSIS — E7800 Pure hypercholesterolemia, unspecified: Secondary | ICD-10-CM | POA: Diagnosis not present

## 2016-06-24 DIAGNOSIS — G301 Alzheimer's disease with late onset: Secondary | ICD-10-CM | POA: Diagnosis not present

## 2016-07-15 DIAGNOSIS — L989 Disorder of the skin and subcutaneous tissue, unspecified: Secondary | ICD-10-CM | POA: Diagnosis not present

## 2016-09-11 DIAGNOSIS — C44329 Squamous cell carcinoma of skin of other parts of face: Secondary | ICD-10-CM | POA: Diagnosis not present

## 2016-09-11 DIAGNOSIS — Z85828 Personal history of other malignant neoplasm of skin: Secondary | ICD-10-CM | POA: Diagnosis not present

## 2016-09-11 DIAGNOSIS — C4442 Squamous cell carcinoma of skin of scalp and neck: Secondary | ICD-10-CM | POA: Diagnosis not present

## 2016-12-08 ENCOUNTER — Emergency Department (HOSPITAL_COMMUNITY): Payer: PPO

## 2016-12-08 ENCOUNTER — Observation Stay (HOSPITAL_COMMUNITY)
Admission: EM | Admit: 2016-12-08 | Discharge: 2016-12-09 | Disposition: A | Discharge status: Home / Self Care | Payer: PPO | Attending: Internal Medicine | Admitting: Internal Medicine

## 2016-12-08 ENCOUNTER — Encounter (HOSPITAL_COMMUNITY): Payer: Self-pay | Admitting: Emergency Medicine

## 2016-12-08 ENCOUNTER — Observation Stay (HOSPITAL_BASED_OUTPATIENT_CLINIC_OR_DEPARTMENT_OTHER): Payer: PPO

## 2016-12-08 DIAGNOSIS — R55 Syncope and collapse: Principal | ICD-10-CM | POA: Diagnosis present

## 2016-12-08 DIAGNOSIS — M8580 Other specified disorders of bone density and structure, unspecified site: Secondary | ICD-10-CM | POA: Diagnosis not present

## 2016-12-08 DIAGNOSIS — I517 Cardiomegaly: Secondary | ICD-10-CM | POA: Insufficient documentation

## 2016-12-08 DIAGNOSIS — R001 Bradycardia, unspecified: Secondary | ICD-10-CM | POA: Diagnosis not present

## 2016-12-08 DIAGNOSIS — S22000A Wedge compression fracture of unspecified thoracic vertebra, initial encounter for closed fracture: Secondary | ICD-10-CM | POA: Diagnosis present

## 2016-12-08 DIAGNOSIS — R5381 Other malaise: Secondary | ICD-10-CM

## 2016-12-08 DIAGNOSIS — E860 Dehydration: Secondary | ICD-10-CM | POA: Diagnosis present

## 2016-12-08 DIAGNOSIS — I959 Hypotension, unspecified: Secondary | ICD-10-CM | POA: Diagnosis not present

## 2016-12-08 DIAGNOSIS — I480 Paroxysmal atrial fibrillation: Secondary | ICD-10-CM | POA: Diagnosis not present

## 2016-12-08 DIAGNOSIS — F0390 Unspecified dementia without behavioral disturbance: Secondary | ICD-10-CM | POA: Insufficient documentation

## 2016-12-08 DIAGNOSIS — M4854XA Collapsed vertebra, not elsewhere classified, thoracic region, initial encounter for fracture: Secondary | ICD-10-CM | POA: Diagnosis not present

## 2016-12-08 DIAGNOSIS — Z79899 Other long term (current) drug therapy: Secondary | ICD-10-CM | POA: Diagnosis not present

## 2016-12-08 DIAGNOSIS — K529 Noninfective gastroenteritis and colitis, unspecified: Secondary | ICD-10-CM | POA: Diagnosis not present

## 2016-12-08 DIAGNOSIS — R404 Transient alteration of awareness: Secondary | ICD-10-CM | POA: Diagnosis not present

## 2016-12-08 DIAGNOSIS — R109 Unspecified abdominal pain: Secondary | ICD-10-CM | POA: Diagnosis not present

## 2016-12-08 DIAGNOSIS — R531 Weakness: Secondary | ICD-10-CM | POA: Diagnosis not present

## 2016-12-08 LAB — TROPONIN I: Troponin I: <0.03 ng/mL (ref <0.03)

## 2016-12-08 LAB — C DIFFICILE QUICK SCREEN W PCR REFLEX
C DIFFICLE (CDIFF) ANTIGEN: POSITIVE — AB
C Diff toxin: NEGATIVE

## 2016-12-08 LAB — ECHOCARDIOGRAM COMPLETE

## 2016-12-08 LAB — CBC WITH DIFFERENTIAL/PLATELET
Basophils Absolute: 0 10*3/uL (ref 0.0–0.1)
Basophils Relative: 0 %
Eosinophils Absolute: 0.1 10*3/uL (ref 0.0–0.7)
Eosinophils Relative: 1 %
HEMATOCRIT: 38.9 % (ref 36.0–46.0)
Hemoglobin: 12.7 g/dL (ref 12.0–15.0)
LYMPHS PCT: 28 %
Lymphs Abs: 2.8 10*3/uL (ref 0.7–4.0)
MCH: 32.4 pg (ref 26.0–34.0)
MCHC: 32.6 g/dL (ref 30.0–36.0)
MCV: 99.2 fL (ref 78.0–100.0)
MONOS PCT: 5 %
Monocytes Absolute: 0.5 10*3/uL (ref 0.1–1.0)
Neutro Abs: 6.7 10*3/uL (ref 1.7–7.7)
Neutrophils Relative %: 66 %
Platelets: 155 10*3/uL (ref 150–400)
RBC: 3.92 MIL/uL (ref 3.87–5.11)
RDW: 14.9 % (ref 11.5–15.5)
WBC: 10.1 10*3/uL (ref 4.0–10.5)

## 2016-12-08 LAB — COMPREHENSIVE METABOLIC PANEL
ALT: 22 U/L (ref 14–54)
AST: 31 U/L (ref 15–41)
Albumin: 3.2 g/dL — ABNORMAL LOW (ref 3.5–5.0)
Alkaline Phosphatase: 69 U/L (ref 38–126)
Anion gap: 11 (ref 5–15)
BILIRUBIN TOTAL: 0.5 mg/dL (ref 0.3–1.2)
BUN: 25 mg/dL — ABNORMAL HIGH (ref 6–20)
CO2: 27 mmol/L (ref 22–32)
CREATININE: 0.8 mg/dL (ref 0.44–1.00)
Calcium: 10.1 mg/dL (ref 8.9–10.3)
Chloride: 102 mmol/L (ref 101–111)
GFR calc Af Amer: >60 mL/min (ref >60)
Glucose, Bld: 153 mg/dL — ABNORMAL HIGH (ref 65–99)
Potassium: 4.2 mmol/L (ref 3.5–5.1)
Sodium: 140 mmol/L (ref 135–145)
TOTAL PROTEIN: 5.6 g/dL — AB (ref 6.5–8.1)

## 2016-12-08 LAB — TSH: TSH: 1.83 u[IU]/mL (ref 0.350–4.500)

## 2016-12-08 LAB — INFLUENZA PANEL BY PCR (TYPE A & B)
INFLAPCR: NEGATIVE
Influenza B By PCR: NEGATIVE

## 2016-12-08 LAB — I-STAT TROPONIN, ED: Troponin i, poc: 0.01 ng/mL (ref 0.00–0.08)

## 2016-12-08 LAB — BRAIN NATRIURETIC PEPTIDE: B Natriuretic Peptide: 63.4 pg/mL (ref 0.0–100.0)

## 2016-12-08 LAB — PROCALCITONIN: Procalcitonin: <0.1 ng/mL

## 2016-12-08 LAB — PHOSPHORUS: PHOSPHORUS: 4.4 mg/dL (ref 2.5–4.6)

## 2016-12-08 LAB — MAGNESIUM: MAGNESIUM: 2.1 mg/dL (ref 1.7–2.4)

## 2016-12-08 LAB — CLOSTRIDIUM DIFFICILE BY PCR: Toxigenic C. Difficile by PCR: NEGATIVE

## 2016-12-08 MED ORDER — IOPAMIDOL (ISOVUE-300) INJECTION 61%
INTRAVENOUS | Status: AC
  Administered 2016-12-08: 100 mL
  Filled 2016-12-08: qty 100

## 2016-12-08 MED ORDER — ONDANSETRON HCL 4 MG/2ML IJ SOLN: 4.0000 mg | Freq: Once | INTRAMUSCULAR | Status: DC

## 2016-12-08 MED ORDER — METRONIDAZOLE IN NACL 5-0.79 MG/ML-% IV SOLN
500.0000 mg | Freq: Three times a day (TID) | INTRAVENOUS | Status: DC
  Administered 2016-12-08 – 2016-12-09 (×4): 500 mg via INTRAVENOUS
  Filled 2016-12-08 (×4): qty 100

## 2016-12-08 MED ORDER — ACETAMINOPHEN 325 MG PO TABS: 650.0000 mg | ORAL_TABLET | ORAL | Status: DC | PRN

## 2016-12-08 MED ORDER — FAMOTIDINE IN NACL 20-0.9 MG/50ML-% IV SOLN
20.0000 mg | Freq: Two times a day (BID) | INTRAVENOUS | Status: DC
  Administered 2016-12-08 – 2016-12-09 (×3): 20 mg via INTRAVENOUS
  Filled 2016-12-08 (×4): qty 50

## 2016-12-08 MED ORDER — ONDANSETRON HCL 4 MG/2ML IJ SOLN: 4.0000 mg | Freq: Four times a day (QID) | INTRAMUSCULAR | Status: DC | PRN

## 2016-12-08 MED ORDER — ACETAMINOPHEN 650 MG RE SUPP: 650.0000 mg | Freq: Four times a day (QID) | RECTAL | Status: DC | PRN

## 2016-12-08 MED ORDER — SODIUM CHLORIDE 0.9 % IV SOLN
INTRAVENOUS | Status: DC
  Administered 2016-12-08: 15:00:00 via INTRAVENOUS
  Administered 2016-12-09: 100 mL via INTRAVENOUS

## 2016-12-08 MED ORDER — KETOROLAC TROMETHAMINE 15 MG/ML IJ SOLN
7.5000 mg | Freq: Three times a day (TID) | INTRAMUSCULAR | Status: DC
  Administered 2016-12-08 – 2016-12-09 (×4): 7.5 mg via INTRAVENOUS
  Filled 2016-12-08 (×4): qty 1

## 2016-12-08 MED ORDER — ENOXAPARIN SODIUM 40 MG/0.4ML ~~LOC~~ SOLN
40.0000 mg | SUBCUTANEOUS | Status: DC
  Administered 2016-12-08: 40 mg via SUBCUTANEOUS
  Filled 2016-12-08: qty 0.4

## 2016-12-08 MED ORDER — ONDANSETRON HCL 4 MG PO TABS: 4.0000 mg | ORAL_TABLET | Freq: Four times a day (QID) | ORAL | Status: DC | PRN

## 2016-12-08 MED ORDER — SODIUM CHLORIDE 0.9% FLUSH
3.0000 mL | Freq: Two times a day (BID) | INTRAVENOUS | Status: DC
  Administered 2016-12-08 – 2016-12-09 (×2): 3 mL via INTRAVENOUS

## 2016-12-08 MED ORDER — ACETAMINOPHEN 325 MG PO TABS: 650.0000 mg | ORAL_TABLET | Freq: Four times a day (QID) | ORAL | Status: DC | PRN

## 2016-12-08 NOTE — Progress Notes (Signed)
  Echocardiogram 2D Echocardiogram has been performed.  Beth Ramos 12/08/2016, 4:50 PM

## 2016-12-08 NOTE — H&P (Signed)
History and Physical    [name] [id number] DOB: 02-08-1923 DOA: 12/08/2016   PCP: Mathews Argyle, MD   Patient coming from/Resides with: Private residence/lives with daughter  Admission status: Observation/telemetry -it may be medically necessary to stay a minimum 2 midnights to rule out impending and/or unexpected changes in physiologic status that may differ from initial evaluation performed in the ER and/or at time of admission for consider reevaluation of admission status in 24 hours   Chief Complaint: Syncope  HPI: JEWELENE Ramos is a 83 y.o. female with medical history significant for dementia, chronic constipation, paroxysmal atrial fibrillation not on anticoagulation who was sent to the ER after having altered mentation and an apparent syncopal episode at home. The patient was on the toilet straining to have a bowel movement when she slumped over. EMS was called at home and the patient was found to be in sinus bradycardia with heart rate in the 40s blood pressure in the 70s. She was transcutaneously paced in the field and by the time she arrived to the ER she was normotensive with a heart rate in the 60s so transcutaneous pacing was discontinued. Based on history it was presumed the patient had vasovagal syncope so [place] were to monitor and potentially sent home but unfortunately the patient had multiple episodes of diarrheal stool since arriving in during another bowel movement patient once again dropped her heart rate and her blood pressure and had a near syncopal episode. CT the abdomen and pelvis did not reveal any major intravascular problem such as a dissecting aneurysm but she was found to have colitis of the distal and sigmoid colon. She has not been on any recent antibiotics. She was not having diarrhea at home. There's not been any blood in diarrhea. She's not had any fevers or chills. Had any upper respiratory infection symptoms. She did receive an influenza vaccine  this year.  ED Course:  Vital Signs: BP 124/57   Pulse 64   Resp 15   SpO2 100%  2 view chest x-ray: Large sliding hiatal hernia without acute cardiopulmonary disease, prominent lower thoracic vertebral body compression fracture new since chest x-ray in 2012 CT abdomen and pelvis with contrast: Old thickening of the descending and sigmoid colon compatible with colitis, intrahepatic bile duct dilatation (in setting of normal LFTs), osteopenia and T12 compression fracture age indeterminate Lab data: Sodium 140, potassium 4.2, chloride 102, CO2 27, glucose 153, BUN 25, creatinine 0.8, calcium 10.1, albumin 3.2, total protein 5.6, BNP 64, poc upon an 0.01, white count 10,100 with normal differential, hemoglobin 12.7, platelet is 155,000, C. difficile antigen positive but toxin is negative-PCR pending Medications and treatments: None-the patient was offered Zofran but she refused  Review of Systems:  In addition to the HPI above **due to patient's dementia, history obtained from the patient's daughter No Fever-chills, myalgias or other constitutional symptoms No Headache, changes with Vision or hearing, new weakness, tingling, numbness in any extremity, dizziness, dysarthria or word finding difficulty, gait disturbance or imbalance, tremors or seizure activity No problems swallowing food or Liquids, indigestion/reflux, choking or coughing while eating, abdominal pain with or after eating No Chest pain, Cough or Shortness of Breath, palpitations, orthopnea or DOE No Abdominal pain reported prior to admission, N/V prior to admission; no melena,hematochezia, dark tarry stools; patient does have a long-standing history of chronic constipation No dysuria, malodorous urine, hematuria or flank pain No new skin rashes, lesions, masses or bruises, No new joint pains, aches, swelling or redness  No recent unintentional weight gain or loss No polyuria, polydypsia or polyphagia   Past Medical History:    Diagnosis Date  . Anemia   . Anxiety   . Depression   . Lymphoma ([place])   . PONV (postoperative nausea and vomiting) 1960    Past Surgical History:  Procedure Laterality Date  . ABDOMINAL HYSTERECTOMY  1960   partial  . APPENDECTOMY  1948  . [place]  . CATARACT EXTRACTION, BILATERAL    . ESOPHAGOGASTRODUODENOSCOPY  11/06/2011   Procedure: ESOPHAGOGASTRODUODENOSCOPY (EGD);  Surgeon: Jeryl Columbia, MD;  Location: [facility];  Service: Endoscopy;  Laterality: N/A;    Social History   Social History  . Marital status: Widowed    Spouse name: N/A  . Number of children: N/A  . Years of education: N/A   Occupational History  . Not on file.   Social History Main Topics  . Smoking status: Never Smoker  . Smokeless tobacco: Never Used  . Alcohol use No  . Drug use: No  . Sexual activity: No   Other Topics Concern  . Not on file   Social History Narrative  . No narrative on file    Mobility: Rolling walker Work history: Not obtained   Allergies  Allergen Reactions  . Sulfa Antibiotics Nausea And Vomiting   Family history reviewed and not pertinent To current admission diagnostic findingt  Prior to Admission medications   Medication Sig Start Date End Date Taking? Authorizing Provider  Ascorbic Acid (VITAMIN C) 100 MG tablet Take 100 mg by mouth daily.   Yes Historical Provider, MD  Cholecalciferol (VITAMIN D PO) Take 1 tablet by mouth daily.   Yes Historical Provider, MD  diltiazem (CARDIZEM SR) 60 MG 12 hr capsule Take 60 mg by mouth daily.   Yes Historical Provider, MD  donepezil (ARICEPT) 10 MG tablet Take 10 mg by mouth at bedtime.   Yes Historical Provider, MD  Multiple Vitamin (MULTIVITAMIN WITH MINERALS) TABS tablet Take 1 tablet by mouth daily.   Yes Historical Provider, MD  traMADol (ULTRAM) 50 MG tablet Take 50 mg by mouth every 8 (eight) hours as needed for moderate pain.   Yes Historical Provider, MD    Physical Exam: Vitals:    12/08/16 1030 12/08/16 1100 12/08/16 1115 12/08/16 1130  BP: (!) 110/47 119/67 127/56 124/57  Pulse: (!) 49 (!) 58 60 64  Resp: 18 17 15 15   SpO2: 100% 99% 100% 100%      Constitutional: NAD, calm, uncomfortable secondary to lower abdominal pain reproduced with palpation Eyes: PERRL, lids and conjunctivae normal ENMT: Mucous membranes are dry. Posterior pharynx clear of any exudate or lesions.Normal dentition.  Neck: normal, supple, no masses, no thyromegaly Respiratory: clear to auscultation bilaterally, no wheezing, no crackles. Normal respiratory effort. No accessory muscle use.  Cardiovascular: Regular rate and rhythm, no murmurs / rubs / gallops. No extremity edema. 2+ pedal pulses. No carotid bruits. Hands and feet somewhat pale and cool to touch Abdomen: Focal tenderness mid abdomen over suprapubic region extending to the umbilicus with an associated fullness appreciated, no definitive masses palpated. No hepatosplenomegaly. Bowel sounds positive.  Musculoskeletal: no clubbing / cyanosis. No joint deformity upper and lower extremities. Good ROM, no contractures. Normal muscle tone.  Skin: no rashes, lesions, ulcers. No induration Neurologic: CN 2-12 grossly intact. Sensation intact, DTR normal. Strength 4-5/5 x all 4 extremities. Testing limited due to patient examined on stretcher Psychiatric: Alert and oriented x name only. Normal  mood although affect somewhat flat.    Labs on Admission: I have personally reviewed following labs and imaging studies  CBC:  Recent Labs Lab 12/08/16 0926  WBC 10.1  NEUTROABS 6.7  HGB 12.7  HCT 38.9  MCV 99.2  PLT 99991111   Basic Metabolic Panel:  Recent Labs Lab 12/08/16 0926  NA 140  K 4.2  CL 102  CO2 27  GLUCOSE 153*  BUN 25*  CREATININE 0.80  CALCIUM 10.1   GFR: CrCl cannot be calculated (Unknown ideal weight.). Liver Function Tests:  Recent Labs Lab 12/08/16 0926  AST 31  ALT 22  ALKPHOS 69  BILITOT 0.5  PROT 5.6*    ALBUMIN 3.2*   No results for input(s): LIPASE, AMYLASE in the last 168 hours. No results for input(s): AMMONIA in the last 168 hours. Coagulation Profile: No results for input(s): INR, PROTIME in the last 168 hours. Cardiac Enzymes: No results for input(s): CKTOTAL, CKMB, CKMBINDEX, TROPONINI in the last 168 hours. BNP (last 3 results) No results for input(s): PROBNP in the last 8760 hours. HbA1C: No results for input(s): HGBA1C in the last 72 hours. CBG: No results for input(s): GLUCAP in the last 168 hours. Lipid Profile: No results for input(s): CHOL, HDL, LDLCALC, TRIG, CHOLHDL, LDLDIRECT in the last 72 hours. Thyroid Function Tests: No results for input(s): TSH, T4TOTAL, FREET4, T3FREE, THYROIDAB in the last 72 hours. Anemia Panel: No results for input(s): VITAMINB12, FOLATE, FERRITIN, TIBC, IRON, RETICCTPCT in the last 72 hours. Urine analysis:    Component Value Date/Time   COLORURINE YELLOW 07/12/2012 0518   APPEARANCEUR CLEAR 07/12/2012 0518   LABSPEC 1.017 07/12/2012 0518   PHURINE 6.0 07/12/2012 0518   GLUCOSEU NEGATIVE 07/12/2012 0518   HGBUR NEGATIVE 07/12/2012 0518   BILIRUBINUR NEGATIVE 07/12/2012 0518   KETONESUR NEGATIVE 07/12/2012 0518   PROTEINUR NEGATIVE 07/12/2012 0518   UROBILINOGEN 1.0 07/12/2012 0518   NITRITE NEGATIVE 07/12/2012 0518   LEUKOCYTESUR SMALL (A) 07/12/2012 0518   Sepsis Labs: @LABRCNTIP (procalcitonin:4,lacticidven:4) ) Recent Results (from the past 240 hour(s))  C difficile quick scan w PCR reflex     Status: Abnormal   Collection Time: 12/08/16 11:53 AM  Result Value Ref Range Status   C Diff antigen POSITIVE (A) NEGATIVE Final   C Diff toxin NEGATIVE NEGATIVE Final   C Diff interpretation Results are indeterminate. See PCR results.  Final    Comment: NEGATIVE     Radiological Exams on Admission: Dg Chest 2 View  Result Date: 12/08/2016 CLINICAL DATA:  Syncope. EXAM: CHEST  2 VIEW COMPARISON:  11/04/2011. FINDINGS:  Mediastinum and hilar structures are normal. Cardiomegaly with normal pulmonary vascularity. Large sliding hiatal hernia. No pleural effusion or pneumothorax. Multilevel degenerative change thoracic spine. A 8 prominent lower thoracic vertebral body compression fracture is noted. This is new from prior chest x-ray of 11/04/2011. IMPRESSION: 1. Large sliding hiatal hernia.  No acute cardiopulmonary disease. 2. Prominent lower thoracic vertebral body compression fracture, new from prior chest x-ray of 11/04/2011. Electronically Signed   By: Marcello Moores  Register   On: 12/08/2016 09:42   Ct Abdomen Pelvis W Contrast  Result Date: 12/08/2016 CLINICAL DATA:  Evaluate for abdominal aortic aneurysm. Mid abdominal pain and hypotension. EXAM: CT ABDOMEN AND PELVIS WITH CONTRAST TECHNIQUE: Multidetector CT imaging of the abdomen and pelvis was performed using the standard protocol following bolus administration of intravenous contrast. CONTRAST:  159mL ISOVUE-300 IOPAMIDOL (ISOVUE-300) INJECTION 61% COMPARISON:  PET-CT from 10/01/2013 FINDINGS: Lower chest: Large hiatal hernia identified.  No pleural fluid noted. Scar like density identified within the lingula and posteromedial right lower lobe. Hepatobiliary: There is no focal liver abnormality identified. Mild distension of the gallbladder. There is dilatation of the common bile duct which measures up to 1.3 cm. Mild intrahepatic bile duct dilatation identified. Pancreas: Atrophy of the pancreatic parenchyma. No discrete mass or inflammation identified. Spleen: Normal in size without focal abnormality. Adrenals/Urinary Tract: The adrenal glands are normal. Small low attenuation structure within the upper pole of left kidney likely represents a simple cyst but is too small to reliably characterize. No mass or hydronephrosis noted. Urinary bladder appears normal. Stomach/Bowel: The small bowel loops have a normal caliber. No pathologic dilatation of the small or large bowel  loops. Colonic diverticulosis is identified. Wall edema involving the descending colon and sigmoid colon is identified. No significant free fluid. No pneumatosis or bowel perforation. Vascular/Lymphatic: Aortic atherosclerosis identified. The suprarenal abdominal aorta measures 2.2 cm in diameter. The infrarenal abdominal aorta has a maximum diameter of 1.9 cm. No upper abdominal adenopathy. No pelvic or inguinal adenopathy. Reproductive: Status post hysterectomy. No adnexal masses. Other: No free fluid or fluid collections identified within the abdomen or pelvis. Musculoskeletal: The bones appear diffusely osteopenic. There is no aggressive lytic or sclerotic bone lesions. Chronic appearing compression deformity involving the T12 vertebra is noted with loss of 50% of the vertebral body height. IMPRESSION: 1. Aortic atherosclerosis without abdominal aortic aneurysm. 2. Wall thickening involving the descending colon and sigmoid colon is identified compatible with colitis. No complications identified. 3. Intrahepatic bile duct dilatation with increase caliber of the common bile duct. If there is a clinical concern for obstructing stone or mass and MRCP with contrast material would be advised. 4. Hiatal hernia. 5. Osteopenia and T12 compression fracture. Electronically Signed   By: Kerby Moors M.D.   On: 12/08/2016 12:26    EKG: (Independently reviewed) sinus rhythm with ventricular rate 69 bpm, QTC 443 ms, normal R-wave progression, no ischemic changes and unchanged from previous EKG  Assessment/Plan Principal Problem:   Vasovagal syncope -Presents with 2 episodes today of bradycardia/hypotension with associated syncope during attempts to have a bowel movement -Patient has developed diarrhea since arrival therefore syncope complicated by associated diarrheal volume losses -Returned somewhat quickly to baseline re: heart rate and blood pressure after each episode -Echocardiogram -Cycle  troponin -Orthostatic vital signs if patient able to perform -Telemetry monitoring -Hold potentially offending medications: Cardizem and Aricept  Active Problems:   Dehydration -Has an acute on chronic component -Daughter reports poor oral intake at home -Now with acute diarrhea -Continue IV fluids    Colitis -Patient developed nonbloody diarrhea after arrival -CT confirms colitis -Has tender fullness in mid abdomen extending to the umbilicus- uncertain if related to colitis or to distended bladder therefore ck bladder scan -No recent antibiotics and C. difficile antigen positive but toxin negative; gastrointestinal PCR panel pending as is c diff PCR -Toxigenic C. difficile by PCR was also Negative (which is more suggestive of colonization) treatment not necessary unless significant symptoms present-patient has continued to have significant high volume diarrhea requiring flex cc also I will initiate IV Flagyl -Clear liquids until diarrhea and abdominal pain improved -Toradol 7.5 mg IV every 8 hours along with IV Pepcid 20 mg every 12 hours to treat pain -No indications to begin empiric antibiotics -Chek Procalcitonin -With diarrhea follow electrolytes and check magnesium    History of Paroxysmal a-fib  -Currently maintaining sinus rhythm -Apparently episodic in the remote past  and has not been on anticoagulation for this    Thoracic compression fracture Lebron Quam -Incidental finding on CT abdomen and pelvis and new compared to imaging in 2012 -Patient not reporting any back pain but is limited in her mobility and due to her dementia does not report pain unless stimulated -PT/OT evaluation -Toradol as above    Dementia -Holding Aricept as above      DVT prophylaxis: Lovenox Code Status: Full-confirmed by daughter at bedside Family Communication: Daughter at bedside Disposition Plan: Discharge back to preadmission home environment when medically stable Consults called:  None     Suzette Flagler L. ANP-BC [facility] Pager [phone]   If 7PM-7AM, please contact night-coverage [email or web address] Password [email or web address]  12/08/2016, 1:41 PM

## 2016-12-08 NOTE — ED Notes (Signed)
Pt telemetry monitor showing SB at 40 bpm with BP 63/51, patient on bedpan having bowel movement at this time. MD Plunkett made aware of hypotensive event and ordered to obtain new EKG and start fluids. Pt remains alert.

## 2016-12-08 NOTE — ED Notes (Signed)
Pt continues to have diarrhea. Pt cleansed. Peri care performed prior to in and out cath

## 2016-12-08 NOTE — ED Notes (Addendum)
Bladder scan showed >400. Output approx 13mL. Pt urinated after scan, post void scan >300.Notified NP. Verbal order to in and out cath.

## 2016-12-08 NOTE — ED Provider Notes (Signed)
[facility] DEPT Provider Note   CSN: [id number] Arrival date & time: 12/08/16  0909     History   Chief Complaint Chief Complaint  Patient presents with  . Bradycardia    HPI Beth Ramos is a 83 y.o. female.  Patient is a 83 year old female with a history of paroxysmal atrial fibrillation, lymphoma and anemia presenting today with near syncope. Patient states she felt her normal self when she got up this morning. She ate a bowl of cereal and took her Cardizem. She then went to the bathroom and had a large bowel movement. After that she became generally weak and had difficulty making it back to the bed. When EMS arrived patient's heart rate was 40s with sinus bradycardia and blood pressure initially of 70 systolic which improved to 80. Patient initially would only open and close her and her eyes but during transport became more responsive. Patient is complaining of lower back pain which was hurting when she woke up but denies any injury. She denies any urinary complaints, shortness of breath, chest pain, nausea, vomiting.  Patient has been on Cardizem for a long time and has not recently changed any dosage   The history is provided by the EMS personnel and the patient.    Past Medical History:  Diagnosis Date  . Anemia   . Anxiety   . Depression   . Lymphoma ([place])   . PONV (postoperative nausea and vomiting) 1960    Patient Active Problem List   Diagnosis Date Noted  . Pelvic fracture ([place]) 07/11/2012  . H/O: depression 07/11/2012  . Paroxysmal a-fib ([place]) 11/06/2011  . Hypokalemia 11/06/2011  . Near syncope 11/03/2011  . Leukocytosis 11/03/2011  . Tachycardia 11/03/2011  . Weakness generalized 11/03/2011    Past Surgical History:  Procedure Laterality Date  . ABDOMINAL HYSTERECTOMY  1960   partial  . APPENDECTOMY  1948  . [place]  . CATARACT EXTRACTION, BILATERAL    . ESOPHAGOGASTRODUODENOSCOPY  11/06/2011   Procedure:  ESOPHAGOGASTRODUODENOSCOPY (EGD);  Surgeon: Jeryl Columbia, MD;  Location: [facility];  Service: Endoscopy;  Laterality: N/A;    OB History    No data available       Home Medications    Prior to Admission medications   Medication Sig Start Date End Date Taking? Authorizing Provider  Cholecalciferol (VITAMIN D PO) Take 1 tablet by mouth daily.    Historical Provider, MD  diltiazem (CARDIZEM SR) 60 MG 12 hr capsule Take 60 mg by mouth daily.    Historical Provider, MD  donepezil (ARICEPT) 10 MG tablet Take 10 mg by mouth at bedtime.    Historical Provider, MD  HYDROcodone-acetaminophen (NORCO/VICODIN) 5-325 MG tablet Take 1 tablet by mouth every 6 (six) hours as needed for moderate pain.    Historical Provider, MD  Multiple Vitamin (MULTIVITAMIN WITH MINERALS) TABS tablet Take 1 tablet by mouth daily.    Historical Provider, MD  traMADol (ULTRAM) 50 MG tablet Take 50 mg by mouth every 8 (eight) hours as needed for moderate pain.    Historical Provider, MD    Family History No family history on file.  Social History Social History  Substance Use Topics  . Smoking status: Never Smoker  . Smokeless tobacco: Never Used  . Alcohol use No     Allergies   Sulfa antibiotics   Review of Systems Review of Systems  All other systems reviewed and are negative.    Physical Exam Updated Vital Signs BP Marland Kitchen)  122/54 (BP Location: Right Arm)   Pulse (!) 53   Resp 16   SpO2 100%   Physical Exam  Constitutional: She is oriented to person, place, and time. She appears well-developed and well-nourished. No distress.  HENT:  Head: Normocephalic and atraumatic.  Mouth/Throat: Oropharynx is clear and moist.  Eyes: Conjunctivae and EOM are normal. Pupils are equal, round, and reactive to light.  Neck: Normal range of motion. Neck supple.  Cardiovascular: Regular rhythm and intact distal pulses.  Bradycardia present.   No murmur heard. Pulmonary/Chest: Effort normal and breath sounds  normal. No respiratory distress. She has no wheezes. She has no rales.  Abdominal: Soft. She exhibits no distension. There is tenderness. There is no rebound and no guarding.  Minimal periumbilical pain without rebound or guarding  Musculoskeletal: Normal range of motion. She exhibits edema. She exhibits no tenderness.  1+ pitting edema bilateral lower extremities  Neurological: She is alert and oriented to person, place, and time.  Skin: Skin is warm and dry. Capillary refill takes 2 to 3 seconds. No rash noted. No erythema. There is pallor.  Psychiatric: She has a normal mood and affect. Her behavior is normal.  Nursing note and vitals reviewed.    ED Treatments / Results  Labs (all labs ordered are listed, but only abnormal results are displayed) Labs Reviewed  COMPREHENSIVE METABOLIC PANEL - Abnormal; Notable for the following:       Result Value   Glucose, Bld 153 (*)    BUN 25 (*)    Total Protein 5.6 (*)    Albumin 3.2 (*)    All other components within normal limits  C DIFFICILE QUICK SCREEN W PCR REFLEX  CBC WITH DIFFERENTIAL/PLATELET  BRAIN NATRIURETIC PEPTIDE  I-STAT TROPOININ, ED    EKG  EKG Interpretation  Date/Time:  Monday December 08 2016 09:17:40 EST Ventricular Rate:  51 PR Interval:    QRS Duration: 92 QT Interval:  442 QTC Calculation: 408 R Axis:   64 Text Interpretation:  Sinus rhythm Borderline T wave abnormalities T wave inversion Anterior leads RESOLVED SINCE PREVIOUS Confirmed by Maryan Rued  MD, Yesha Muchow ([id number]) on 12/08/2016 9:36:54 AM       Radiology Dg Chest 2 View  Result Date: 12/08/2016 CLINICAL DATA:  Syncope. EXAM: CHEST  2 VIEW COMPARISON:  11/04/2011. FINDINGS: Mediastinum and hilar structures are normal. Cardiomegaly with normal pulmonary vascularity. Large sliding hiatal hernia. No pleural effusion or pneumothorax. Multilevel degenerative change thoracic spine. A 8 prominent lower thoracic vertebral body compression fracture is noted.  This is new from prior chest x-ray of 11/04/2011. IMPRESSION: 1. Large sliding hiatal hernia.  No acute cardiopulmonary disease. 2. Prominent lower thoracic vertebral body compression fracture, new from prior chest x-ray of 11/04/2011. Electronically Signed   By: Marcello Moores  Register   On: 12/08/2016 09:42   Ct Abdomen Pelvis W Contrast  Result Date: 12/08/2016 CLINICAL DATA:  Evaluate for abdominal aortic aneurysm. Mid abdominal pain and hypotension. EXAM: CT ABDOMEN AND PELVIS WITH CONTRAST TECHNIQUE: Multidetector CT imaging of the abdomen and pelvis was performed using the standard protocol following bolus administration of intravenous contrast. CONTRAST:  126mL ISOVUE-300 IOPAMIDOL (ISOVUE-300) INJECTION 61% COMPARISON:  PET-CT from 10/01/2013 FINDINGS: Lower chest: Large hiatal hernia identified. No pleural fluid noted. Scar like density identified within the lingula and posteromedial right lower lobe. Hepatobiliary: There is no focal liver abnormality identified. Mild distension of the gallbladder. There is dilatation of the common bile duct which measures up to 1.3 cm. Mild  intrahepatic bile duct dilatation identified. Pancreas: Atrophy of the pancreatic parenchyma. No discrete mass or inflammation identified. Spleen: Normal in size without focal abnormality. Adrenals/Urinary Tract: The adrenal glands are normal. Small low attenuation structure within the upper pole of left kidney likely represents a simple cyst but is too small to reliably characterize. No mass or hydronephrosis noted. Urinary bladder appears normal. Stomach/Bowel: The small bowel loops have a normal caliber. No pathologic dilatation of the small or large bowel loops. Colonic diverticulosis is identified. Wall edema involving the descending colon and sigmoid colon is identified. No significant free fluid. No pneumatosis or bowel perforation. Vascular/Lymphatic: Aortic atherosclerosis identified. The suprarenal abdominal aorta measures 2.2  cm in diameter. The infrarenal abdominal aorta has a maximum diameter of 1.9 cm. No upper abdominal adenopathy. No pelvic or inguinal adenopathy. Reproductive: Status post hysterectomy. No adnexal masses. Other: No free fluid or fluid collections identified within the abdomen or pelvis. Musculoskeletal: The bones appear diffusely osteopenic. There is no aggressive lytic or sclerotic bone lesions. Chronic appearing compression deformity involving the T12 vertebra is noted with loss of 50% of the vertebral body height. IMPRESSION: 1. Aortic atherosclerosis without abdominal aortic aneurysm. 2. Wall thickening involving the descending colon and sigmoid colon is identified compatible with colitis. No complications identified. 3. Intrahepatic bile duct dilatation with increase caliber of the common bile duct. If there is a clinical concern for obstructing stone or mass and MRCP with contrast material would be advised. 4. Hiatal hernia. 5. Osteopenia and T12 compression fracture. Electronically Signed   By: Kerby Moors M.D.   On: 12/08/2016 12:26    Procedures Procedures (including critical care time)  Medications Ordered in ED Medications - No data to display   Initial Impression / Assessment and Plan / ED Course  I have reviewed the triage vital signs and the nursing notes.  Pertinent labs & imaging results that were available during my care of the patient were reviewed by me and considered in my medical decision making (see chart for details).  Clinical Course     Patient is an elderly female with relatively few medical problems presenting today with a near syncopal event. This occurred after a large bowel movement and patient was found when EMS arrived to be bradycardic and hypotensive. Upon arrival here patient's heart rate has improved to the 50s to 60s. Blood pressure is 122/54. She is currently in no acute distress. She does state she doesn't feel great but only complaint is of lower back  pain. Unclear how long this back pain has been going on. She denies any chest pain, shortness of breath, nausea, vomiting or fever. This episode today could be related to a vasovagal event versus dysrhythmia versus ACS versus anemia. Patient does have some mild abdominal discomfort but low suspicion for aortic aneurysm.  EKG shows sinus bradycardia. Blood sugar is greater than 100. CBC, CMP, troponin, chest x-ray pending  11:01 AM EKG without acute findings. Troponin, CBC and CMP without acute findings. BMP within normal limits and chest x-ray without acute findings. Patient had a recurrent episode here where she had a bowel movement and had recurrent bradycardia and hypotension. Female be related to vasovagal event however will also do a CT to rule out a AAA.  Had a discussion with patient's daughter and at this time she is full code.  12:44 PM CT with colitis but no AAA.  Will admit for supportive care.  C.diff pending  Final Clinical Impressions(s) / ED Diagnoses  Final diagnoses:  Vasovagal near syncope  Colitis    New Prescriptions New Prescriptions   No medications on file     Blanchie Dessert, MD 12/08/16 1244

## 2016-12-08 NOTE — ED Triage Notes (Signed)
Pt to ER by [facility] from home where family activated EMS for altered mental status - per EMS, family states patient was using the restroom attempting to have a bowel movement when she slumped over and became "minimally responsive. On EMS arrival patient HR found to be sinus brady at 40 bpm with BP systolic 70. EMS proceeded to pace patient which increased level of alertness and BP to 80's, on arrival patient HR 60, pacing turned off per MD Plunkett and BP 122/74. Pt is alert to baseline. Hx of dementia.

## 2016-12-08 NOTE — ED Notes (Signed)
Pt continues to have diarrhea. Notified MD and obtained sample. Cleaned pt and changed linens

## 2016-12-09 DIAGNOSIS — K529 Noninfective gastroenteritis and colitis, unspecified: Secondary | ICD-10-CM

## 2016-12-09 DIAGNOSIS — E860 Dehydration: Secondary | ICD-10-CM

## 2016-12-09 DIAGNOSIS — R55 Syncope and collapse: Secondary | ICD-10-CM

## 2016-12-09 LAB — CBC
HCT: 35.4 % — ABNORMAL LOW (ref 36.0–46.0)
HEMOGLOBIN: 11.6 g/dL — AB (ref 12.0–15.0)
MCH: 32 pg (ref 26.0–34.0)
MCHC: 32.8 g/dL (ref 30.0–36.0)
MCV: 97.5 fL (ref 78.0–100.0)
Platelets: 147 10*3/uL — ABNORMAL LOW (ref 150–400)
RBC: 3.63 MIL/uL — AB (ref 3.87–5.11)
RDW: 14.7 % (ref 11.5–15.5)
WBC: 9.2 10*3/uL (ref 4.0–10.5)

## 2016-12-09 LAB — GASTROINTESTINAL PANEL BY PCR, STOOL (REPLACES STOOL CULTURE)

## 2016-12-09 LAB — BASIC METABOLIC PANEL
ANION GAP: 9 (ref 5–15)
BUN: 21 mg/dL — AB (ref 6–20)
CHLORIDE: 105 mmol/L (ref 101–111)
CO2: 25 mmol/L (ref 22–32)
Calcium: 9.1 mg/dL (ref 8.9–10.3)
Creatinine, Ser: 0.62 mg/dL (ref 0.44–1.00)
GFR calc Af Amer: >60 mL/min (ref >60)
GFR calc non Af Amer: >60 mL/min (ref >60)
GLUCOSE: 104 mg/dL — AB (ref 65–99)
POTASSIUM: 3.7 mmol/L (ref 3.5–5.1)
Sodium: 139 mmol/L (ref 135–145)

## 2016-12-09 LAB — GLUCOSE, CAPILLARY
GLUCOSE-CAPILLARY: 77 mg/dL (ref 65–99)
GLUCOSE-CAPILLARY: 72 mg/dL (ref 65–99)

## 2016-12-09 LAB — LACTOFERRIN, FECAL, QUALITATIVE: Lactoferrin, Fecal, Qual: POSITIVE — AB

## 2016-12-09 LAB — TROPONIN I

## 2016-12-09 MED ORDER — HALOPERIDOL LACTATE 5 MG/ML IJ SOLN
0.5000 mg | Freq: Once | INTRAMUSCULAR | Status: AC | PRN
  Administered 2016-12-09: 0.5 mg via INTRAVENOUS
  Filled 2016-12-09: qty 1

## 2016-12-09 MED ORDER — METRONIDAZOLE 500 MG PO TABS: 500.0000 mg | ORAL_TABLET | Freq: Three times a day (TID) | ORAL | 0 refills | Status: DC

## 2016-12-09 NOTE — Care Management Note (Signed)
Case Management Note  Patient Details  Name: Beth Ramos MRN: [id number] Date of Birth: 07-05-1923  Subjective/Objective:   Pt presents with AMS/ syncope, history of dementia, chronic constipation, paroxysmal atrial fibrillation. Resides with daughter, Katharine Look. Katharine Look states PTA mom was independent with ADL's. DME: walker.  [name] (Daughter)     [phone]      PCP: Lajean Manes  Action/Plan: PT/OT evaluations pending... CM to f/u with disposition needs.  Expected Discharge Date:                  Expected Discharge Plan:  [place]  In-House Referral:     Discharge planning Services  CM Consult  Post Acute Care Choice:    Choice offered to:  Adult Children  DME Arranged:    DME Agency:     HH Arranged:    HH Agency:  [facility]// chosen if home health service are needed @ d/c.  Status of Service:  In process, will continue to follow  If discussed at Long Length of Stay Meetings, dates discussed:    Additional Comments:  Sharin Mons, RN 12/09/2016, 3:09 PM

## 2016-12-09 NOTE — Progress Notes (Signed)
Discharge instructions, RX's and follow up appts explained and provided to patient and daughter verbalized understanding. Patient left floor via wheelchair accompanied by staff no c/o pain or shortness of breath at d/c.   Mieka Leaton Lynn, RN  

## 2016-12-09 NOTE — Discharge Summary (Signed)
Physician Discharge Summary  Beth Ramos [id number] DOB: June 29, 1923 DOA: 12/08/2016  PCP: Mathews Argyle, MD  Admit date: 12/08/2016 Discharge date: 12/09/2016  Admitted From: Home Disposition: Home  Recommendations for Outpatient Follow-up:  She will complete 7 day course of Flagyl on 1/23 Follow-up with PCP in one week   Home Health: none Equipment/Devices: none   Discharge Condition:fair CODE STATUS: full code Diet recommendation: regular    Discharge Diagnoses:  Principal Problem:   Vasovagal syncope  Active Problems:   Paroxysmal a-fib ([place])   Dementia   Dehydration   Colitis   Thoracic compression fracture ([place])  Brief narrative Please refer to admission H&P for details, in brief, 83 y.o. female with medical history significant for dementia, chronic constipation, paroxysmal atrial fibrillation not on anticoagulation who was sent to the ER after having altered mentation and an apparent syncopal episode at home. The patient was on the toilet straining to have a bowel movement when she slumped over. EMS was called at home and the patient was found to be in sinus bradycardia with heart rate in the 40s blood pressure in the 70s. She was transcutaneously paced in the field and by the time she arrived to the ER she was normotensive with a heart rate in the 60s so transcutaneous pacing was discontinued. Based on history it was presumed the patient had vasovagal syncope so [place] were to monitor and potentially sent home but unfortunately the patient had multiple episodes of diarrheal stool since arriving in during another bowel movement patient once again dropped her heart rate and her blood pressure and had a near syncopal episode. CT the abdomen and pelvis did not reveal any major intravascular problem such as a dissecting aneurysm but she was found to have colitis of the distal and sigmoid colon. She has not been on any recent antibiotics. She was not having diarrhea at  home. There's not been any blood in diarrhea. She's not had any fevers or chills. No respiratory infection symptoms.   In the ED vitals were stable. X-ray showed sliding hiatal hernia. CT abdomen and pelvis showed thickening of descending and sigmoid colon compatible with colitis. Labs were unremarkable. C. difficile antigen was positive but toxin was negative. Patient placed on empiric IV Flagyl and IV fluids and placed on observation on telemetry.   hospital course Vasovagal syncope 2 episodes of bradycardia with hypertension while attempting to have bowel movement. Patient developed diarrhea thereafter. Stable on telemetry. 2-D echo with EF at 65-70% with grade 1 diastolic dysfunction. Mild AS noted.  Dehydration Likely a combination of poor by mouth intake and episodes of diarrhea. Improved with fluids.  Acute colitis Nonbloody diarrhea on presentation. On admission she was found to have fullness in her midabdomen. C. difficile antigen positive but PCR negative. Will treat with 70 course of empiric Flagyl. GI pathogen panel was negative as well.  History of paroxysmal A. fib Continue Cardizem.  Chronic thoracic compression fracture/osteopenia. Follow-up with PCP  Patient seen by PT and no further follow-up or home health needed.   stable to be discharged home.  CODE STATUS: Full code  Consults: None  Procedure: CT abdomen and pelvis 2-D echo  Discharge Instructions   Allergies as of 12/09/2016      Reactions   Sulfa Antibiotics Nausea And Vomiting      Medication List    TAKE these medications   diltiazem 60 MG 12 hr capsule Commonly known as:  CARDIZEM SR Take 60 mg by mouth daily.  donepezil 10 MG tablet Commonly known as:  ARICEPT Take 10 mg by mouth at bedtime.   metroNIDAZOLE 500 MG tablet Commonly known as:  FLAGYL Take 1 tablet (500 mg total) by mouth 3 (three) times daily.   multivitamin with minerals Tabs tablet Take 1 tablet by mouth daily.    traMADol 50 MG tablet Commonly known as:  ULTRAM Take 50 mg by mouth every 8 (eight) hours as needed for moderate pain.   vitamin C 100 MG tablet Take 100 mg by mouth daily.   VITAMIN D PO Take 1 tablet by mouth daily.      Follow-up Information    Mathews Argyle, MD. Schedule an appointment as soon as possible for a visit in 1 week(s).   Specialty:  Internal Medicine Contact information: [address] [place] [address] [phone]          Allergies  Allergen Reactions  . Sulfa Antibiotics Nausea And Vomiting      Procedures/Studies: Dg Chest 2 View  Result Date: 12/08/2016 CLINICAL DATA:  Syncope. EXAM: CHEST  2 VIEW COMPARISON:  11/04/2011. FINDINGS: Mediastinum and hilar structures are normal. Cardiomegaly with normal pulmonary vascularity. Large sliding hiatal hernia. No pleural effusion or pneumothorax. Multilevel degenerative change thoracic spine. A 8 prominent lower thoracic vertebral body compression fracture is noted. This is new from prior chest x-ray of 11/04/2011. IMPRESSION: 1. Large sliding hiatal hernia.  No acute cardiopulmonary disease. 2. Prominent lower thoracic vertebral body compression fracture, new from prior chest x-ray of 11/04/2011. Electronically Signed   By: Marcello Moores  Register   On: 12/08/2016 09:42   Ct Abdomen Pelvis W Contrast  Result Date: 12/08/2016 CLINICAL DATA:  Evaluate for abdominal aortic aneurysm. Mid abdominal pain and hypotension. EXAM: CT ABDOMEN AND PELVIS WITH CONTRAST TECHNIQUE: Multidetector CT imaging of the abdomen and pelvis was performed using the standard protocol following bolus administration of intravenous contrast. CONTRAST:  187mL ISOVUE-300 IOPAMIDOL (ISOVUE-300) INJECTION 61% COMPARISON:  PET-CT from 10/01/2013 FINDINGS: Lower chest: Large hiatal hernia identified. No pleural fluid noted. Scar like density identified within the lingula and posteromedial right lower lobe. Hepatobiliary: There  is no focal liver abnormality identified. Mild distension of the gallbladder. There is dilatation of the common bile duct which measures up to 1.3 cm. Mild intrahepatic bile duct dilatation identified. Pancreas: Atrophy of the pancreatic parenchyma. No discrete mass or inflammation identified. Spleen: Normal in size without focal abnormality. Adrenals/Urinary Tract: The adrenal glands are normal. Small low attenuation structure within the upper pole of left kidney likely represents a simple cyst but is too small to reliably characterize. No mass or hydronephrosis noted. Urinary bladder appears normal. Stomach/Bowel: The small bowel loops have a normal caliber. No pathologic dilatation of the small or large bowel loops. Colonic diverticulosis is identified. Wall edema involving the descending colon and sigmoid colon is identified. No significant free fluid. No pneumatosis or bowel perforation. Vascular/Lymphatic: Aortic atherosclerosis identified. The suprarenal abdominal aorta measures 2.2 cm in diameter. The infrarenal abdominal aorta has a maximum diameter of 1.9 cm. No upper abdominal adenopathy. No pelvic or inguinal adenopathy. Reproductive: Status post hysterectomy. No adnexal masses. Other: No free fluid or fluid collections identified within the abdomen or pelvis. Musculoskeletal: The bones appear diffusely osteopenic. There is no aggressive lytic or sclerotic bone lesions. Chronic appearing compression deformity involving the T12 vertebra is noted with loss of 50% of the vertebral body height. IMPRESSION: 1. Aortic atherosclerosis without abdominal aortic aneurysm. 2. Wall thickening involving the  descending colon and sigmoid colon is identified compatible with colitis. No complications identified. 3. Intrahepatic bile duct dilatation with increase caliber of the common bile duct. If there is a clinical concern for obstructing stone or mass and MRCP with contrast material would be advised. 4. Hiatal hernia.  5. Osteopenia and T12 compression fracture. Electronically Signed   By: Kerby Moors M.D.   On: 12/08/2016 12:26       Subjective:  denies further abdominal pain. Stable on telemetry.   Discharge Exam: Vitals:   12/09/16 0705 12/09/16 1023  BP: (!) 126/48 (!) 98/44  Pulse: (!) 120 (!) 58  Resp: (!) 22 16  Temp:  97.3 F (36.3 C)   Vitals:   12/08/16 1543 12/09/16 0029 12/09/16 0705 12/09/16 1023  BP: (!) 122/50 (!) 110/46 (!) 126/48 (!) 98/44  Pulse: 66 (!) 59 (!) 120 (!) 58  Resp: 18 18 (!) 22 16  Temp:    97.3 F (36.3 C)  TempSrc:    Oral  SpO2: 99% 99% (!) 75% 97%    GeneralElderly thin built female not in distress   HEENT: No pallor, moist mucosa, supple neck Chest: Clear to auscultate bilaterally CVS: Normal S1 and S2, no murmurs rub or gallop GI: Soft, nondistended, nontender, bowel sounds present X line must discuss: Warm, no edema CNS: Alert and oriented      The results of significant diagnostics from this hospitalization (including imaging, microbiology, ancillary and laboratory) are listed below for reference.     Microbiology: Recent Results (from the past 240 hour(s))  Gastrointestinal Panel by PCR , Stool     Status: None   Collection Time: 12/08/16 11:50 AM  Result Value Ref Range Status   Campylobacter species NOT DETECTED NOT DETECTED Final   Plesimonas shigelloides NOT DETECTED NOT DETECTED Final   Salmonella species NOT DETECTED NOT DETECTED Final   Yersinia enterocolitica NOT DETECTED NOT DETECTED Final   Vibrio species NOT DETECTED NOT DETECTED Final   Vibrio cholerae NOT DETECTED NOT DETECTED Final   Enteroaggregative E coli (EAEC) NOT DETECTED NOT DETECTED Final   Enteropathogenic E coli (EPEC) NOT DETECTED NOT DETECTED Final   Enterotoxigenic E coli (ETEC) NOT DETECTED NOT DETECTED Final   Shiga like toxin producing E coli (STEC) NOT DETECTED NOT DETECTED Final   Shigella/Enteroinvasive E coli (EIEC) NOT DETECTED NOT DETECTED Final    Cryptosporidium NOT DETECTED NOT DETECTED Final   Cyclospora cayetanensis NOT DETECTED NOT DETECTED Final   Entamoeba histolytica NOT DETECTED NOT DETECTED Final   Giardia lamblia NOT DETECTED NOT DETECTED Final   Adenovirus F40/41 NOT DETECTED NOT DETECTED Final   Astrovirus NOT DETECTED NOT DETECTED Final   Norovirus GI/GII NOT DETECTED NOT DETECTED Final   Rotavirus A NOT DETECTED NOT DETECTED Final   Sapovirus (I, II, IV, and V) NOT DETECTED NOT DETECTED Final  C difficile quick scan w PCR reflex     Status: Abnormal   Collection Time: 12/08/16 11:53 AM  Result Value Ref Range Status   C Diff antigen POSITIVE (A) NEGATIVE Final   C Diff toxin NEGATIVE NEGATIVE Final   C Diff interpretation Results are indeterminate. See PCR results.  Final    Comment: NEGATIVE  Clostridium Difficile by PCR     Status: None   Collection Time: 12/08/16 11:53 AM  Result Value Ref Range Status   Toxigenic C Difficile by pcr NEGATIVE NEGATIVE Final    Comment: Patient is colonized with non toxigenic C. difficile. May not  need treatment unless significant symptoms are present.     Labs: BNP (last 3 results)  Recent Labs  12/08/16 0924  BNP [id number]   Basic Metabolic Panel:  Recent Labs Lab 12/08/16 0926 12/08/16 1831 12/09/16 0035  NA 140  --  139  K 4.2  --  3.7  CL 102  --  105  CO2 27  --  25  GLUCOSE 153*  --  104*  BUN 25*  --  21*  CREATININE 0.80  --  0.62  CALCIUM 10.1  --  9.1  MG  --  2.1  --   PHOS  --  4.4  --    Liver Function Tests:  Recent Labs Lab 12/08/16 0926  AST 31  ALT 22  ALKPHOS 69  BILITOT 0.5  PROT 5.6*  ALBUMIN 3.2*   No results for input(s): LIPASE, AMYLASE in the last 168 hours. No results for input(s): AMMONIA in the last 168 hours. CBC:  Recent Labs Lab 12/08/16 0926 12/09/16 0035  WBC 10.1 9.2  NEUTROABS 6.7  --   HGB 12.7 11.6*  HCT 38.9 35.4*  MCV 99.2 97.5  PLT 155 147*   Cardiac Enzymes:  Recent Labs Lab 12/08/16 1322  12/08/16 1831 12/09/16 0035  TROPONINI <0.03 <0.03 <0.03   BNP: Invalid input(s): POCBNP CBG:  Recent Labs Lab 12/09/16 0654 12/09/16 0746  GLUCAP 77 72   D-Dimer No results for input(s): DDIMER in the last 72 hours. Hgb A1c No results for input(s): HGBA1C in the last 72 hours. Lipid Profile No results for input(s): CHOL, HDL, LDLCALC, TRIG, CHOLHDL, LDLDIRECT in the last 72 hours. Thyroid function studies  Recent Labs  12/08/16 1322  TSH 1.830   Anemia work up No results for input(s): VITAMINB12, FOLATE, FERRITIN, TIBC, IRON, RETICCTPCT in the last 72 hours. Urinalysis    Component Value Date/Time   COLORURINE YELLOW 07/12/2012 0518   APPEARANCEUR CLEAR 07/12/2012 0518   LABSPEC 1.017 07/12/2012 0518   PHURINE 6.0 07/12/2012 0518   GLUCOSEU NEGATIVE 07/12/2012 0518   HGBUR NEGATIVE 07/12/2012 0518   BILIRUBINUR NEGATIVE 07/12/2012 0518   KETONESUR NEGATIVE 07/12/2012 0518   PROTEINUR NEGATIVE 07/12/2012 0518   UROBILINOGEN 1.0 07/12/2012 0518   NITRITE NEGATIVE 07/12/2012 0518   LEUKOCYTESUR SMALL (A) 07/12/2012 0518   Sepsis Labs Invalid input(s): PROCALCITONIN,  WBC,  LACTICIDVEN Microbiology Recent Results (from the past 240 hour(s))  Gastrointestinal Panel by PCR , Stool     Status: None   Collection Time: 12/08/16 11:50 AM  Result Value Ref Range Status   Campylobacter species NOT DETECTED NOT DETECTED Final   Plesimonas shigelloides NOT DETECTED NOT DETECTED Final   Salmonella species NOT DETECTED NOT DETECTED Final   Yersinia enterocolitica NOT DETECTED NOT DETECTED Final   Vibrio species NOT DETECTED NOT DETECTED Final   Vibrio cholerae NOT DETECTED NOT DETECTED Final   Enteroaggregative E coli (EAEC) NOT DETECTED NOT DETECTED Final   Enteropathogenic E coli (EPEC) NOT DETECTED NOT DETECTED Final   Enterotoxigenic E coli (ETEC) NOT DETECTED NOT DETECTED Final   Shiga like toxin producing E coli (STEC) NOT DETECTED NOT DETECTED Final    Shigella/Enteroinvasive E coli (EIEC) NOT DETECTED NOT DETECTED Final   Cryptosporidium NOT DETECTED NOT DETECTED Final   Cyclospora cayetanensis NOT DETECTED NOT DETECTED Final   Entamoeba histolytica NOT DETECTED NOT DETECTED Final   Giardia lamblia NOT DETECTED NOT DETECTED Final   Adenovirus F40/41 NOT DETECTED NOT DETECTED Final   Astrovirus NOT  DETECTED NOT DETECTED Final   Norovirus GI/GII NOT DETECTED NOT DETECTED Final   Rotavirus A NOT DETECTED NOT DETECTED Final   Sapovirus (I, II, IV, and V) NOT DETECTED NOT DETECTED Final  C difficile quick scan w PCR reflex     Status: Abnormal   Collection Time: 12/08/16 11:53 AM  Result Value Ref Range Status   C Diff antigen POSITIVE (A) NEGATIVE Final   C Diff toxin NEGATIVE NEGATIVE Final   C Diff interpretation Results are indeterminate. See PCR results.  Final    Comment: NEGATIVE  Clostridium Difficile by PCR     Status: None   Collection Time: 12/08/16 11:53 AM  Result Value Ref Range Status   Toxigenic C Difficile by pcr NEGATIVE NEGATIVE Final    Comment: Patient is colonized with non toxigenic C. difficile. May not need treatment unless significant symptoms are present.     Time coordinating discharge: <30 minutes  SIGNED:   Louellen Molder, MD  [facility] 12/09/2016, 4:29 PM Pager   If 7PM-7AM, please contact night-coverage [email or web address] Password [email or web address]

## 2016-12-09 NOTE — Evaluation (Signed)
Occupational Therapy Evaluation Patient Details Name: Beth Ramos MRN: [id number] DOB: 14-Oct-1923 Today's Date: 12/09/2016    History of Present Illness Pt presents with AMS/ syncope, history of dementia, chronic constipation, paroxysmal atrial fibrillation.. She  presents with syncope likely from vasovagal episodes. Found to have persistent bradycardia immediately after event. Pt also fairly dehydrated from profound diarrhea   Clinical Impression   Pt completes selfcare tasks at a supervision level overall with min to mod instructional cueing for walker safety.  Feel this is likely her baseline and will be OK to discharge back home with her daughter.  Recommend daughter provide as much supervision as possible at discharge to reduce fall risk.  No follow-up recommended.    Follow Up Recommendations  No OT follow up    Equipment Recommendations  None recommended by OT    Recommendations for Other Services       Precautions / Restrictions Precautions Precautions: Fall Restrictions Weight Bearing Restrictions: No      Mobility Bed Mobility Overal bed mobility: Modified Independent             General bed mobility comments: sit to/from supine  Transfers Overall transfer level: Independent Equipment used: Rolling walker (2 wheeled)             General transfer comment: pivot to commode, sit to stand from raised bed    Balance Overall balance assessment: Needs assistance Sitting-balance support: No upper extremity supported;Feet supported Sitting balance-Leahy Scale: Good     Standing balance support: Single extremity supported Standing balance-Leahy Scale: Good Standing balance comment: able to perform perianal hygeine in standing after use of BSC                            ADL Overall ADL's : At baseline                                       General ADL Comments: Pt currently supervision level for completion of bathing,  dressing, and toileting tasks.  Feel she needs supervision for safety which her daughter can provide on a limited basis, but not 24 hour.  Recommend increased supervision at discharge but no further OT needs.       Vision Vision Assessment?: No apparent visual deficits   Perception     Praxis      Pertinent Vitals/Pain Pain Assessment: No/denies pain     Hand Dominance     Extremity/Trunk Assessment Upper Extremity Assessment Upper Extremity Assessment: Overall WFL for tasks assessed   Lower Extremity Assessment Lower Extremity Assessment: Defer to PT evaluation   Cervical / Trunk Assessment Cervical / Trunk Assessment: Kyphotic   Communication Communication Communication: No difficulties;HOH   Cognition Arousal/Alertness: Awake/alert Behavior During Therapy: WFL for tasks assessed/performed Overall Cognitive Status: History of cognitive impairments - at baseline                 General Comments: Pt needs cueing to stay inside of the walker and to not stand without it.  Feel this is likely baseline based on her diagnosis of dementia.              Home Living Family/patient expects to be discharged to:: Private residence Living Arrangements: Children Available Help at Discharge: Family;Available PRN/intermittently Type of Home: House Home Access: Stairs to enter [employer] of Steps: 2 Entrance  Stairs-Rails: Can reach both Home Layout: One level         Bathroom Toilet: Standard Bathroom Accessibility: Yes (unable to use RW in bathroom)   Home Equipment: Walker - 2 wheels;Cane - quad   Additional Comments: dau works at [employer] directly across the street, checks on pt every 1-2 hrs      Prior Functioning/Environment Level of Independence: Independent with assistive device(s)        Comments: sponge bathes, independent with ADLs and simple meal prep              OT Goals(Current goals can be found in the care plan section) Acute  Rehab OT Goals Patient Stated Goal: go home by tonight if possible  OT Frequency: Min 2X/week              End of Session Equipment Utilized During Treatment: Rolling walker Nurse Communication: Mobility status  Activity Tolerance: Patient tolerated treatment well Patient left: in bed;with call bell/phone within reach   Time: 1633-1701 OT Time Calculation (min): 28 min Charges:  OT General Charges $OT Visit: 1 Procedure OT Evaluation $OT Eval Moderate Complexity: 1 Procedure OT Treatments $Self Care/Home Management : 8-22 mins G-Codes: OT G-codes **NOT FOR INPATIENT CLASS** Functional Assessment Tool Used: clinical judgement Functional Limitation: Self care Self Care Current Status [id number]): At least 1 percent but less than 20 percent impaired, limited or restricted Self Care Goal Status [id number]): At least 1 percent but less than 20 percent impaired, limited or restricted Self Care Discharge Status [phone]): At least 1 percent but less than 20 percent impaired, limited or restricted  Dakoda Bassette OTR/L 12/09/2016, 5:18 PM

## 2016-12-09 NOTE — Evaluation (Signed)
Physical Therapy Evaluation Patient Details Name: Beth Ramos MRN: [id number] DOB: Feb 04, 1923 Today's Date: 12/09/2016   History of Present Illness  Pt presents with AMS/ syncope, history of dementia, chronic constipation, paroxysmal atrial fibrillation.. She  presents with syncope likely from vasovagal episodes. Found to have persistent bradycardia immediately after event. Pt also fairly dehydrated from profound diarrhea  Clinical Impression  Pt near baseline performance for mobility.  She has all necessary DME and will have intermittent supervision at home.  Pt and her dau feel confident they can manage current needs at home.  No need for follow up services. Pt/dau agreeable to discharge home today.    Follow Up Recommendations No PT follow up;Supervision - Intermittent    Equipment Recommendations  None recommended by PT    Recommendations for Other Services       Precautions / Restrictions Precautions Precautions: Fall Restrictions Weight Bearing Restrictions: No      Mobility  Bed Mobility Overal bed mobility: Independent             General bed mobility comments: sit to/from supine  Transfers Overall transfer level: Independent Equipment used: Rolling walker (2 wheeled)             General transfer comment: pivot to commode, sit to stand from raised bed  Ambulation/Gait Ambulation/Gait assistance: Supervision Ambulation Distance (Feet): 450 Feet Assistive device: Rolling walker (2 wheeled) Gait Pattern/deviations: WFL(Within Functional Limits)     General Gait Details: occasional drift to side, able to self recover  Stairs            Wheelchair Mobility    Modified Rankin (Stroke Patients Only)       Balance Overall balance assessment: Needs assistance Sitting-balance support: No upper extremity supported;Feet supported Sitting balance-Leahy Scale: Good     Standing balance support: Single extremity supported Standing  balance-Leahy Scale: Good Standing balance comment: able to perform perianal hygeine in standing after use of BSC                             Pertinent Vitals/Pain Pain Assessment: No/denies pain    Home Living Family/patient expects to be discharged to:: Private residence Living Arrangements: Children Available Help at Discharge: Family;Available PRN/intermittently Type of Home: House Home Access: Stairs to enter Entrance Stairs-Rails: Can reach both Entrance Stairs-Number of Steps: 2 Home Layout: One level Home Equipment: Walker - 2 wheels;Cane - quad Additional Comments: dau works at [employer] directly across the street, checks on pt every 1-2 hrs    Prior Function Level of Independence: Independent with assistive device(s)         Comments: sponge bathes, independent with ADLs and simple meal prep     Hand Dominance        Extremity/Trunk Assessment   Upper Extremity Assessment Upper Extremity Assessment: Overall WFL for tasks assessed    Lower Extremity Assessment Lower Extremity Assessment: Overall WFL for tasks assessed    Cervical / Trunk Assessment Cervical / Trunk Assessment: Kyphotic  Communication   Communication: No difficulties;HOH  Cognition Arousal/Alertness: Awake/alert Behavior During Therapy: WFL for tasks assessed/performed Overall Cognitive Status: History of cognitive impairments - at baseline                 General Comments: moves spontaneously in room, needs cues to wait for staff to manage equipment    General Comments General comments (skin integrity, edema, etc.): no c/o lightheadedness or SOB during mobility  Exercises     Assessment/Plan    PT Assessment Patent does not need any further PT services  PT Problem List            PT Treatment Interventions      PT Goals (Current goals can be found in the Care Plan section)  Acute Rehab PT Goals Patient Stated Goal: go home by tonight if possible PT Goal  Formulation: With patient/family    Frequency     Barriers to discharge        Co-evaluation               End of Session Equipment Utilized During Treatment: Gait belt Activity Tolerance: Patient tolerated treatment well Patient left: in bed;with call bell/phone within reach;with bed alarm set;with nursing/sitter in room;with family/visitor present Nurse Communication: Mobility status    Functional Assessment Tool Used: transfers, gait Functional Limitation: Mobility: Walking and moving around Mobility: Walking and Moving Around Current Status [id number]): At least 1 percent but less than 20 percent impaired, limited or restricted Mobility: Walking and Moving Around Goal Status [phone]): At least 1 percent but less than 20 percent impaired, limited or restricted Mobility: Walking and Moving Around Discharge Status [phone]): At least 1 percent but less than 20 percent impaired, limited or restricted    Time: 1559-1626 PT Time Calculation (min) (ACUTE ONLY): 27 min   Charges:   PT Evaluation $PT Eval Low Complexity: 1 Procedure     PT G Codes:   PT G-Codes **NOT FOR INPATIENT CLASS** Functional Assessment Tool Used: transfers, gait Functional Limitation: Mobility: Walking and moving around Mobility: Walking and Moving Around Current Status [id number]): At least 1 percent but less than 20 percent impaired, limited or restricted Mobility: Walking and Moving Around Goal Status [phone]): At least 1 percent but less than 20 percent impaired, limited or restricted Mobility: Walking and Moving Around Discharge Status [phone]): At least 1 percent but less than 20 percent impaired, limited or restricted   Malka So, August  [place] 12/09/2016, 4:41 PM

## 2016-12-17 DIAGNOSIS — R55 Syncope and collapse: Secondary | ICD-10-CM | POA: Diagnosis not present

## 2016-12-17 DIAGNOSIS — K5900 Constipation, unspecified: Secondary | ICD-10-CM | POA: Diagnosis not present

## 2016-12-17 DIAGNOSIS — G301 Alzheimer's disease with late onset: Secondary | ICD-10-CM | POA: Diagnosis not present

## 2017-01-31 ENCOUNTER — Emergency Department (HOSPITAL_COMMUNITY): Payer: PPO

## 2017-01-31 ENCOUNTER — Encounter (HOSPITAL_COMMUNITY): Payer: Self-pay

## 2017-01-31 ENCOUNTER — Emergency Department (HOSPITAL_COMMUNITY)
Admission: EM | Admit: 2017-01-31 | Discharge: 2017-01-31 | Disposition: A | Discharge status: Home / Self Care | Payer: PPO | Attending: Emergency Medicine | Admitting: Emergency Medicine

## 2017-01-31 DIAGNOSIS — M542 Cervicalgia: Secondary | ICD-10-CM | POA: Diagnosis not present

## 2017-01-31 DIAGNOSIS — Y9301 Activity, walking, marching and hiking: Secondary | ICD-10-CM | POA: Insufficient documentation

## 2017-01-31 DIAGNOSIS — Y929 Unspecified place or not applicable: Secondary | ICD-10-CM | POA: Insufficient documentation

## 2017-01-31 DIAGNOSIS — Z79899 Other long term (current) drug therapy: Secondary | ICD-10-CM | POA: Insufficient documentation

## 2017-01-31 DIAGNOSIS — S01112A Laceration without foreign body of left eyelid and periocular area, initial encounter: Secondary | ICD-10-CM | POA: Diagnosis not present

## 2017-01-31 DIAGNOSIS — W0110XA Fall on same level from slipping, tripping and stumbling with subsequent striking against unspecified object, initial encounter: Secondary | ICD-10-CM | POA: Diagnosis not present

## 2017-01-31 DIAGNOSIS — Z23 Encounter for immunization: Secondary | ICD-10-CM | POA: Diagnosis not present

## 2017-01-31 DIAGNOSIS — Y999 Unspecified external cause status: Secondary | ICD-10-CM | POA: Insufficient documentation

## 2017-01-31 DIAGNOSIS — R51 Headache: Secondary | ICD-10-CM | POA: Diagnosis not present

## 2017-01-31 DIAGNOSIS — S199XXA Unspecified injury of neck, initial encounter: Secondary | ICD-10-CM | POA: Diagnosis not present

## 2017-01-31 DIAGNOSIS — S0990XA Unspecified injury of head, initial encounter: Secondary | ICD-10-CM | POA: Diagnosis not present

## 2017-01-31 DIAGNOSIS — S0093XA Contusion of unspecified part of head, initial encounter: Secondary | ICD-10-CM | POA: Diagnosis not present

## 2017-01-31 DIAGNOSIS — S0993XA Unspecified injury of face, initial encounter: Secondary | ICD-10-CM | POA: Diagnosis not present

## 2017-01-31 DIAGNOSIS — W19XXXA Unspecified fall, initial encounter: Secondary | ICD-10-CM

## 2017-01-31 LAB — I-STAT CHEM 8, ED
BUN: 15 mg/dL (ref 6–20)
CALCIUM ION: 1.22 mmol/L (ref 1.15–1.40)
CREATININE: 0.5 mg/dL (ref 0.44–1.00)
Chloride: 99 mmol/L — ABNORMAL LOW (ref 101–111)
Glucose, Bld: 107 mg/dL — ABNORMAL HIGH (ref 65–99)
HEMATOCRIT: 39 % (ref 36.0–46.0)
HEMOGLOBIN: 13.3 g/dL (ref 12.0–15.0)
Potassium: 4.2 mmol/L (ref 3.5–5.1)
SODIUM: 137 mmol/L (ref 135–145)
TCO2: 30 mmol/L (ref 0–100)

## 2017-01-31 LAB — I-STAT TROPONIN, ED: Troponin i, poc: 0 ng/mL (ref 0.00–0.08)

## 2017-01-31 LAB — CBG MONITORING, ED: Glucose-Capillary: 96 mg/dL (ref 65–99)

## 2017-01-31 MED ORDER — LIDOCAINE-EPINEPHRINE (PF) 2 %-1:200000 IJ SOLN
INTRAMUSCULAR | Status: AC
  Filled 2017-01-31: qty 20

## 2017-01-31 MED ORDER — LIDOCAINE-EPINEPHRINE-TETRACAINE (LET) SOLUTION
3.0000 mL | Freq: Once | NASAL | Status: DC
  Filled 2017-01-31: qty 3

## 2017-01-31 MED ORDER — TETANUS-DIPHTH-ACELL PERTUSSIS 5-2.5-18.5 LF-MCG/0.5 IM SUSP
0.5000 mL | Freq: Once | INTRAMUSCULAR | Status: AC
Start: 2017-01-31 — End: 2017-01-31
  Administered 2017-01-31: 0.5 mL via INTRAMUSCULAR
  Filled 2017-01-31: qty 0.5

## 2017-01-31 MED ORDER — LIDOCAINE-EPINEPHRINE (PF) 2 %-1:200000 IJ SOLN
10.0000 mL | Freq: Once | INTRAMUSCULAR | Status: AC
  Administered 2017-01-31: 10 mL

## 2017-01-31 NOTE — ED Notes (Signed)
Bed: WA09 Expected date:  Expected time:  Means of arrival:  Comments: 83 yo fall

## 2017-01-31 NOTE — ED Triage Notes (Signed)
Patient coming from home after having a fall. Pt lives with her daughter. Pt was walking using a  walker and slipped out fell forward. Pt has Laceration to left side of her head. Pt c/o head and neck pain. Pt has hx of dementia.  BP145/72  HR-83 CBG 86 per ems.  Pt is not on blood thinners.

## 2017-01-31 NOTE — Discharge Instructions (Addendum)
Please follow up with your primary care provider in 7 days for suture removal. Please use your walker at all times when ambulating at home.  Keep your laceration dry for the next 2 days to allow the skin to heal.  After two days you can wash your laceration with mild water to keep it clean.  Monitor for signs of infection including increased redness, swelling, discharge or fever.   Your CT scans were negative today.  Your blood work was also normal.  We recommend that a family member walks with you at home for the next 2-3 days as you may be sore and unsteady after your fall today.  It is best to walk with someone at all times during this time to prevent another fall.  Place ice over your laceration to decrease inflammation.    Return to the emergency department as needed

## 2017-01-31 NOTE — ED Provider Notes (Signed)
[facility] DEPT Provider Note   CSN: [id number] Arrival date & time: 01/31/17  1436     History   Chief Complaint Chief Complaint  Patient presents with  . Fall    HPI Beth Ramos is a 83 y.o. female with past medical history of anemia, anxiety and benzodiazepine daily, depression, paroxysmal atrial fibrillation on diltiazem but no anticoagulants presents to the ED after unwitnessed fall PTA. Patient's daughter at bedside provides most of the history. Patient's daughter tells me that she heard a loud breathing and found her mother on the floor with her walker next to her. Reportedly patient was unable to stand up on her own and her daughter was unable to assist her up. Patient's daughter states that patient had a witnessed, mechanical fall two months ago in which patient fell backwards and bumped the back of her head.  Patient did not come to ED for this fall.  Patient denies chest pain, shortness of breath, palpitations, dizziness, nausea or diaphoresis prior to fall. Patient is not able to tell me the details of her fall. Patient reports mild headache but no other pain anywhere else.Reportedly patient ambulates at home on her own with a walker and has been doing well and has been at cognitive baseline over the last few weeks.. No recent fevers, vomiting, diarrhea, changes in appetite or recent confusion per daughter.  HPI  Past Medical History:  Diagnosis Date  . Anemia   . Anxiety   . Depression   . Lymphoma ([place])   . PONV (postoperative nausea and vomiting) 1960    Patient Active Problem List   Diagnosis Date Noted  . Vasovagal syncope 12/08/2016  . Dementia 12/08/2016  . Dehydration 12/08/2016  . Colitis 12/08/2016  . Thoracic compression fracture ([place]) 12/08/2016  . Pelvic fracture ([place]) 07/11/2012  . H/O: depression 07/11/2012  . Paroxysmal a-fib ([place]) 11/06/2011  . Hypokalemia 11/06/2011  . Leukocytosis 11/03/2011  . Tachycardia 11/03/2011  . Weakness  generalized 11/03/2011    Past Surgical History:  Procedure Laterality Date  . ABDOMINAL HYSTERECTOMY  1960   partial  . APPENDECTOMY  1948  . [place]  . CATARACT EXTRACTION, BILATERAL    . ESOPHAGOGASTRODUODENOSCOPY  11/06/2011   Procedure: ESOPHAGOGASTRODUODENOSCOPY (EGD);  Surgeon: Jeryl Columbia, MD;  Location: [facility];  Service: Endoscopy;  Laterality: N/A;    OB History    No data available       Home Medications    Prior to Admission medications   Medication Sig Start Date End Date Taking? Authorizing Provider  Ascorbic Acid (VITAMIN C) 100 MG tablet Take 100 mg by mouth daily.   Yes Historical Provider, MD  Chlorphen-PE-Acetaminophen (CORICIDIN D COLD/FLU/SINUS PO) Take 1 tablet by mouth as needed (cold).   Yes Historical Provider, MD  Cholecalciferol (VITAMIN D PO) Take 1 tablet by mouth daily.   Yes Historical Provider, MD  diltiazem (CARDIZEM SR) 60 MG 12 hr capsule Take 60 mg by mouth daily.   Yes Historical Provider, MD  dimenhyDRINATE (DRAMAMINE) 50 MG tablet Take 50 mg by mouth daily as needed for dizziness.   Yes Historical Provider, MD  donepezil (ARICEPT) 10 MG tablet Take 10 mg by mouth at bedtime.   Yes Historical Provider, MD  guaiFENesin (MUCINEX) 600 MG 12 hr tablet Take 600 mg by mouth 2 (two) times daily.   Yes Historical Provider, MD  Multiple Vitamin (MULTIVITAMIN WITH MINERALS) TABS tablet Take 1 tablet by mouth daily.   Yes Historical  Provider, MD  traMADol (ULTRAM) 50 MG tablet Take 50 mg by mouth every 8 (eight) hours as needed for moderate pain.   Yes Historical Provider, MD  metroNIDAZOLE (FLAGYL) 500 MG tablet Take 1 tablet (500 mg total) by mouth 3 (three) times daily. Patient not taking: Reported on 01/31/2017 12/09/16   Louellen Molder, MD    Family History No family history on file.  Social History Social History  Substance Use Topics  . Smoking status: Never Smoker  . Smokeless tobacco: Never Used  . Alcohol use  No     Allergies   Sulfa antibiotics   Review of Systems Review of Systems  Constitutional: Negative for activity change, appetite change, chills, fever and unexpected weight change.  HENT: Negative for congestion and sore throat.   Eyes: Positive for pain (left eye laceration). Negative for visual disturbance.  Respiratory: Negative for cough, chest tightness and shortness of breath.   Cardiovascular: Negative for chest pain, palpitations and leg swelling.  Gastrointestinal: Negative for abdominal pain, constipation, diarrhea, nausea and vomiting.  Genitourinary: Negative for difficulty urinating, dysuria, frequency, hematuria and pelvic pain.  Musculoskeletal: Positive for neck pain. Negative for arthralgias, back pain and neck stiffness.  Skin: Positive for wound. Negative for rash.  Neurological: Negative for dizziness, seizures, syncope, weakness, light-headedness, numbness and headaches.  Hematological: Does not bruise/bleed easily.  Psychiatric/Behavioral: Negative.      Physical Exam Updated Vital Signs BP 153/77   Pulse 77   Temp 97.8 F (36.6 C) (Oral)   Resp 15   Ht 5' (1.524 m)   Wt 48.5 kg   SpO2 96%   BMI 20.90 kg/m   Physical Exam  Constitutional: She is oriented to person, place, and time. She appears well-developed and well-nourished.  Patient found laying on her left side with a cervical collar in place.  HENT:  Head: Normocephalic and atraumatic.  Nose: Nose normal.  Mouth/Throat: Oropharynx is clear and moist. No oropharyngeal exudate.  Moist mucous membranes. No intraoral injuries or bleeding.  Eyes: Conjunctivae and EOM are normal. Pupils are equal, round, and reactive to light.  Neck: Normal range of motion. Neck supple. No JVD present.  Cardiovascular: Normal rate, regular rhythm, normal heart sounds and intact distal pulses.   No murmur heard. Pulmonary/Chest: Effort normal and breath sounds normal. No respiratory distress. She has no  wheezes. She has no rales. She exhibits no tenderness.  Abdominal: Soft. Bowel sounds are normal. She exhibits no distension and no mass. There is no tenderness. There is no rebound and no guarding.  Musculoskeletal: Normal range of motion. She exhibits tenderness. She exhibits no deformity.  Tenderness over spinous processes of cervical spine was significant at C7.  Full passive ROM of bilateral shoulders, elbows, wrists, hips, knees and ankles without reported pain. No focal bony tenderness over large joints of upper and lower extremities.  Lymphadenopathy:    She has no cervical adenopathy.  Neurological: She is alert and oriented to person, place, and time. No cranial nerve deficit or sensory deficit. She exhibits normal muscle tone.  Pt is alert and oriented to self, year, place. She was able to tell me her daughter's name.  Speech and phonation normal.   Thought process coherent.   Strength 5/5 in upper and lower extremities.   Sensation to light touch intact in upper and lower extremities.  Gait normal.   Negative Romberg. No leg drift.   Finger to nose test not tested, patient could not follow command secondary to  hearing impairment. CN I not tested CN II full visual fields  CN III, IV, VI PEERL and EOM intact CN V light touch intact in all 3 divisions of trigeminal nerve CN VII facial nerve movements intact, symmetric CN VIII hearing intact to finger rub CN IX, X no uvula deviation, symmetric soft palate rise CN XI 5/5 SCM and trapezius strength  CN XII Tongue midline with symmetric L/R movement  Skin: Skin is warm and dry. Capillary refill takes less than 2 seconds. Abrasion noted. No pallor.  4 cm laceration to the left eyebrow with smooth edges, no active bleeding.  Psychiatric: She has a normal mood and affect. Her behavior is normal. Judgment and thought content normal.  Nursing note and vitals reviewed.    ED Treatments / Results  Labs (all labs ordered are listed,  but only abnormal results are displayed) Labs Reviewed  I-STAT CHEM 8, ED - Abnormal; Notable for the following:       Result Value   Chloride 99 (*)    Glucose, Bld 107 (*)    All other components within normal limits  I-STAT TROPOININ, ED  CBG MONITORING, ED    EKG  EKG Interpretation  Date/Time:  Saturday January 31 2017 15:10:02 EST Ventricular Rate:  67 PR Interval:    QRS Duration: 88 QT Interval:  380 QTC Calculation: 402 R Axis:   88 Text Interpretation:  Sinus rhythm Probable lateral infarct, old T wave changes in V3 not new q waves in lateral leads not new, but increased amplitude no other changes since last tracint Confirmed by [facility] MD, Corene Cornea [id number]) on 01/31/2017 4:50:41 PM       Radiology Ct Head Wo Contrast  Result Date: 01/31/2017 CLINICAL DATA:  Unwitnessed fall, laceration to left temple, headache, neck pain, history of dementia EXAM: CT HEAD WITHOUT CONTRAST CT CERVICAL SPINE WITHOUT CONTRAST TECHNIQUE: Multidetector CT imaging of the head and cervical spine was performed following the standard protocol without intravenous contrast. Multiplanar CT image reconstructions of the cervical spine were also generated. COMPARISON:  MRI brain dated 01/07/2016 FINDINGS: CT HEAD FINDINGS Brain: No evidence of acute infarction, hemorrhage, hydrocephalus, extra-axial collection or mass lesion/mass effect. Subcortical white matter and periventricular small vessel ischemic changes. Global cortical atrophy. Vascular: No hyperdense vessel or unexpected calcification. Skull: Normal. Negative for fracture or focal lesion. Sinuses/Orbits: The visualized paranasal sinuses are essentially clear. The mastoid air cells are unopacified. Bilateral orbits, including the globes and retroconal soft tissues, are within normal limits. Other: Soft tissue swelling/ laceration overlying the lateral left frontal bone. CT CERVICAL SPINE FINDINGS Alignment: Exaggerated cervical lordosis. Skull base and  vertebrae: No acute fracture. No primary bone lesion or focal pathologic process. Soft tissues and spinal canal: No prevertebral fluid or swelling. No visible canal hematoma. Disc levels:  Mild degenerative changes, most prominent at C5-6. Upper chest: Visualized lung apices are notable for mild biapical pleural-parenchymal scarring. Other: Visualized thyroid is unremarkable. IMPRESSION: Mild soft tissue swelling/ laceration overlying the lateral left frontal bone. No evidence of calvarial fracture. No evidence of acute intracranial abnormality. Atrophy with small vessel ischemic changes. No evidence of traumatic injury to the cervical spine. Mild degenerative changes. Electronically Signed   By: Julian Hy M.D.   On: 01/31/2017 17:23   Ct Cervical Spine Wo Contrast  Result Date: 01/31/2017 CLINICAL DATA:  Unwitnessed fall, laceration to left temple, headache, neck pain, history of dementia EXAM: CT HEAD WITHOUT CONTRAST CT CERVICAL SPINE WITHOUT CONTRAST TECHNIQUE: Multidetector CT imaging  of the head and cervical spine was performed following the standard protocol without intravenous contrast. Multiplanar CT image reconstructions of the cervical spine were also generated. COMPARISON:  MRI brain dated 01/07/2016 FINDINGS: CT HEAD FINDINGS Brain: No evidence of acute infarction, hemorrhage, hydrocephalus, extra-axial collection or mass lesion/mass effect. Subcortical white matter and periventricular small vessel ischemic changes. Global cortical atrophy. Vascular: No hyperdense vessel or unexpected calcification. Skull: Normal. Negative for fracture or focal lesion. Sinuses/Orbits: The visualized paranasal sinuses are essentially clear. The mastoid air cells are unopacified. Bilateral orbits, including the globes and retroconal soft tissues, are within normal limits. Other: Soft tissue swelling/ laceration overlying the lateral left frontal bone. CT CERVICAL SPINE FINDINGS Alignment: Exaggerated cervical  lordosis. Skull base and vertebrae: No acute fracture. No primary bone lesion or focal pathologic process. Soft tissues and spinal canal: No prevertebral fluid or swelling. No visible canal hematoma. Disc levels:  Mild degenerative changes, most prominent at C5-6. Upper chest: Visualized lung apices are notable for mild biapical pleural-parenchymal scarring. Other: Visualized thyroid is unremarkable. IMPRESSION: Mild soft tissue swelling/ laceration overlying the lateral left frontal bone. No evidence of calvarial fracture. No evidence of acute intracranial abnormality. Atrophy with small vessel ischemic changes. No evidence of traumatic injury to the cervical spine. Mild degenerative changes. Electronically Signed   By: Julian Hy M.D.   On: 01/31/2017 17:23    Procedures Procedures (including critical care time)  LACERATION REPAIR Performed by: Kinnie Feil Authorized by: Kinnie Feil Consent: Verbal consent obtained. Risks and benefits: risks, benefits and alternatives were discussed Consent given by: patient Patient identity confirmed: provided demographic data Prepped and Draped in normal sterile fashion Wound explored  Laceration Location: left eyebrow  Laceration Length: 2.5 cm  No Foreign Bodies seen or palpated  Anesthesia: local infiltration  Local anesthetic: lidocaine and epinephrine  Anesthetic total: 4 ml  Irrigation method: syringe Amount of cleaning: standard  Skin closure: simple interrupted  Number of sutures: 5  Technique: primary closure  Patient tolerance: Patient tolerated the procedure well with no immediate complications.  Medications Ordered in ED Medications  lidocaine-EPINEPHrine-tetracaine (LET) solution (3 mLs Topical Not Given 01/31/17 1814)  lidocaine-EPINEPHrine (XYLOCAINE W/EPI) 2 %-1:200000 (PF) injection (not administered)  Tdap (BOOSTRIX) injection 0.5 mL (0.5 mLs Intramuscular Given 01/31/17 1810)  lidocaine-EPINEPHrine  (XYLOCAINE W/EPI) 2 %-1:200000 (PF) injection 10 mL (10 mLs Infiltration Given 01/31/17 1814)     Initial Impression / Assessment and Plan / ED Course  I have reviewed the triage vital signs and the nursing notes.  Pertinent labs & imaging results that were available during my care of the patient were reviewed by me and considered in my medical decision making (see chart for details).  Clinical Course as of Jan 31 2010  Sat Jan 31, 2017  1651 Sinus rhythm Probable lateral infarct, old T wave changes in V3 not new q waves in lateral leads not new, but increased amplitude no other changes since last tracing ED EKG [CG]  1732 IMPRESSION: Mild soft tissue swelling/ laceration overlying the lateral left frontal bone. No evidence of calvarial fracture. No evidence of acute intracranial abnormality. Atrophy with small vessel ischemic changes. No evidence of traumatic injury to the cervical spine. Mild degenerative changes. CT Head Wo Contrast [CG]  1734 Troponin i, poc: 0.00 [CG]  1734 Glucose-Capillary: 96 [CG]  1734 Temp: 97.8 F (36.6 C) [CG]  1734 Pulse Rate: 68 [CG]  1734 BP: 141/64 [CG]  1734 Resp: 15 [CG]  1734 SpO2:  96 % [CG]  7322 I personally ambulated patient in room, she was able to walk 4+ steps with one assist without difficulty  [CG]  2005 Sodium: 137 [CG]  2005 Potassium: 4.2 [CG]  2005 Chloride: (!) 99 [CG]  2005 Creatinine: 0.50 [CG]  2005 Hemoglobin: 13.3 [CG]    Clinical Course User Index [CG] Kinnie Feil, PA-C   83 year old female presents to ED s/p unwitnessed fall at home.  On exam VS are reassuring.  Patient has 2.5 cm laceration which was repaired in the ED by myself without complications.  No neurological deficits.  Patient was not wearing hearing aids but was able to tell me that she tripped, bumped her forehead and denied preceding CP, SOB, dizziness, nausea, diaphoresis prior to fall.  Patient was oriented to self (first and last name), year, place and  was able to tell me her daughter's name at bedside.  Full passive and active ROM of upper and lower extremities without reported pain, no localized bony tenderness along CTL spine or large joints in UE and LE. CT scan head and cervical spine normal.  Troponin and EKG non ischemic.  CBG normal.  Chem 8 without electrolyte abnormalities or anemia.  I personally ambulated patient and she was easily able to take 4+ steps in the ED without complications.  Patient eager to go home, states she is hungry.  Patient is considered safe for discharge at this time.  Doubt intracranial or MSK acute injuries from today's fall.  Patient was discharged with strict ED return precautions.  Patient's daughter advised to assist patient with walking at home over the next few days as patient may be unsteady or sore from recent fall.  Also advised family to monitor patient after taking benzodiazepine for anxiety and tramadol for pain as these two medications can cause drowsiness and predispose her for more falls.  Patient's daughter verbalized understanding and is agreeable to discharge.  Patient will f/u with PCP in 7 days for suture removal, given wound care instrcutions.   Patient, ED treatment and discharge plan was discussed with supervising physician who also evaluated the patient and is agreeable with plan.    Final Clinical Impressions(s) / ED Diagnoses   Final diagnoses:  Fall, initial encounter  Laceration of left eyebrow, initial encounter    New Prescriptions Discharge Medication List as of 01/31/2017  6:51 PM       Kinnie Feil, PA-C 01/31/17 2011    Merrily Pew, MD 02/01/17 1245

## 2017-01-31 NOTE — ED Notes (Signed)
Pt wheeled to sister's vehicle.  Verbalized understanding of discharge instructions.

## 2017-02-09 DIAGNOSIS — Z4802 Encounter for removal of sutures: Secondary | ICD-10-CM | POA: Diagnosis not present

## 2017-04-07 DIAGNOSIS — H6122 Impacted cerumen, left ear: Secondary | ICD-10-CM | POA: Diagnosis not present

## 2017-04-07 DIAGNOSIS — G301 Alzheimer's disease with late onset: Secondary | ICD-10-CM | POA: Diagnosis not present

## 2017-04-07 DIAGNOSIS — F0390 Unspecified dementia without behavioral disturbance: Secondary | ICD-10-CM | POA: Diagnosis not present

## 2017-04-07 DIAGNOSIS — I480 Paroxysmal atrial fibrillation: Secondary | ICD-10-CM | POA: Diagnosis not present

## 2017-08-04 DIAGNOSIS — E7800 Pure hypercholesterolemia, unspecified: Secondary | ICD-10-CM | POA: Diagnosis not present

## 2017-08-04 DIAGNOSIS — Z7189 Other specified counseling: Secondary | ICD-10-CM | POA: Diagnosis not present

## 2017-08-04 DIAGNOSIS — I340 Nonrheumatic mitral (valve) insufficiency: Secondary | ICD-10-CM | POA: Diagnosis not present

## 2017-08-04 DIAGNOSIS — K219 Gastro-esophageal reflux disease without esophagitis: Secondary | ICD-10-CM | POA: Diagnosis not present

## 2017-08-04 DIAGNOSIS — F325 Major depressive disorder, single episode, in full remission: Secondary | ICD-10-CM | POA: Diagnosis not present

## 2017-08-04 DIAGNOSIS — H6123 Impacted cerumen, bilateral: Secondary | ICD-10-CM | POA: Diagnosis not present

## 2017-08-04 DIAGNOSIS — G301 Alzheimer's disease with late onset: Secondary | ICD-10-CM | POA: Diagnosis not present

## 2017-08-04 DIAGNOSIS — I480 Paroxysmal atrial fibrillation: Secondary | ICD-10-CM | POA: Diagnosis not present

## 2017-08-04 DIAGNOSIS — Z23 Encounter for immunization: Secondary | ICD-10-CM | POA: Diagnosis not present

## 2017-08-04 DIAGNOSIS — Z79899 Other long term (current) drug therapy: Secondary | ICD-10-CM | POA: Diagnosis not present

## 2017-08-04 DIAGNOSIS — Z0000 Encounter for general adult medical examination without abnormal findings: Secondary | ICD-10-CM | POA: Diagnosis not present

## 2017-09-24 ENCOUNTER — Encounter (HOSPITAL_COMMUNITY): Payer: Self-pay | Admitting: Radiology

## 2017-09-24 ENCOUNTER — Other Ambulatory Visit: Payer: Self-pay

## 2017-09-24 ENCOUNTER — Emergency Department (HOSPITAL_COMMUNITY): Payer: PPO

## 2017-09-24 ENCOUNTER — Inpatient Hospital Stay (HOSPITAL_COMMUNITY)
Admission: EM | Admit: 2017-09-24 | Discharge: 2017-09-30 | DRG: 884 | Disposition: A | Discharge status: Skilled Nursing Facility | Payer: PPO | Attending: Internal Medicine | Admitting: Internal Medicine

## 2017-09-24 ENCOUNTER — Other Ambulatory Visit: Payer: Self-pay | Admitting: Licensed Clinical Social Worker

## 2017-09-24 DIAGNOSIS — Z90722 Acquired absence of ovaries, bilateral: Secondary | ICD-10-CM

## 2017-09-24 DIAGNOSIS — Z9181 History of falling: Secondary | ICD-10-CM

## 2017-09-24 DIAGNOSIS — R1030 Lower abdominal pain, unspecified: Secondary | ICD-10-CM | POA: Diagnosis not present

## 2017-09-24 DIAGNOSIS — I080 Rheumatic disorders of both mitral and aortic valves: Secondary | ICD-10-CM | POA: Diagnosis present

## 2017-09-24 DIAGNOSIS — N3281 Overactive bladder: Secondary | ICD-10-CM | POA: Diagnosis not present

## 2017-09-24 DIAGNOSIS — R4689 Other symptoms and signs involving appearance and behavior: Secondary | ICD-10-CM | POA: Diagnosis present

## 2017-09-24 DIAGNOSIS — Z7982 Long term (current) use of aspirin: Secondary | ICD-10-CM | POA: Diagnosis not present

## 2017-09-24 DIAGNOSIS — I480 Paroxysmal atrial fibrillation: Secondary | ICD-10-CM | POA: Diagnosis present

## 2017-09-24 DIAGNOSIS — F03918 Unspecified dementia, unspecified severity, with other behavioral disturbance: Secondary | ICD-10-CM

## 2017-09-24 DIAGNOSIS — F329 Major depressive disorder, single episode, unspecified: Secondary | ICD-10-CM | POA: Diagnosis present

## 2017-09-24 DIAGNOSIS — I248 Other forms of acute ischemic heart disease: Secondary | ICD-10-CM | POA: Diagnosis not present

## 2017-09-24 DIAGNOSIS — G9341 Metabolic encephalopathy: Secondary | ICD-10-CM | POA: Diagnosis present

## 2017-09-24 DIAGNOSIS — R110 Nausea: Secondary | ICD-10-CM | POA: Diagnosis not present

## 2017-09-24 DIAGNOSIS — I959 Hypotension, unspecified: Secondary | ICD-10-CM | POA: Diagnosis not present

## 2017-09-24 DIAGNOSIS — R4182 Altered mental status, unspecified: Secondary | ICD-10-CM | POA: Diagnosis not present

## 2017-09-24 DIAGNOSIS — F09 Unspecified mental disorder due to known physiological condition: Secondary | ICD-10-CM | POA: Diagnosis not present

## 2017-09-24 DIAGNOSIS — Z79899 Other long term (current) drug therapy: Secondary | ICD-10-CM | POA: Diagnosis not present

## 2017-09-24 DIAGNOSIS — E860 Dehydration: Secondary | ICD-10-CM | POA: Diagnosis present

## 2017-09-24 DIAGNOSIS — R109 Unspecified abdominal pain: Secondary | ICD-10-CM | POA: Diagnosis not present

## 2017-09-24 DIAGNOSIS — Z90711 Acquired absence of uterus with remaining cervical stump: Secondary | ICD-10-CM

## 2017-09-24 DIAGNOSIS — Z8572 Personal history of non-Hodgkin lymphomas: Secondary | ICD-10-CM | POA: Diagnosis not present

## 2017-09-24 DIAGNOSIS — K449 Diaphragmatic hernia without obstruction or gangrene: Secondary | ICD-10-CM

## 2017-09-24 DIAGNOSIS — Z66 Do not resuscitate: Secondary | ICD-10-CM | POA: Diagnosis not present

## 2017-09-24 DIAGNOSIS — F0390 Unspecified dementia without behavioral disturbance: Secondary | ICD-10-CM | POA: Diagnosis present

## 2017-09-24 DIAGNOSIS — F0391 Unspecified dementia with behavioral disturbance: Principal | ICD-10-CM | POA: Diagnosis present

## 2017-09-24 DIAGNOSIS — F419 Anxiety disorder, unspecified: Secondary | ICD-10-CM | POA: Diagnosis present

## 2017-09-24 DIAGNOSIS — F05 Delirium due to known physiological condition: Secondary | ICD-10-CM | POA: Diagnosis not present

## 2017-09-24 DIAGNOSIS — Z515 Encounter for palliative care: Secondary | ICD-10-CM

## 2017-09-24 DIAGNOSIS — R000 Tachycardia, unspecified: Secondary | ICD-10-CM | POA: Diagnosis not present

## 2017-09-24 DIAGNOSIS — R451 Restlessness and agitation: Secondary | ICD-10-CM | POA: Diagnosis not present

## 2017-09-24 DIAGNOSIS — H9190 Unspecified hearing loss, unspecified ear: Secondary | ICD-10-CM | POA: Diagnosis present

## 2017-09-24 DIAGNOSIS — T461X6A Underdosing of calcium-channel blockers, initial encounter: Secondary | ICD-10-CM | POA: Diagnosis not present

## 2017-09-24 DIAGNOSIS — Z9842 Cataract extraction status, left eye: Secondary | ICD-10-CM

## 2017-09-24 DIAGNOSIS — I340 Nonrheumatic mitral (valve) insufficiency: Secondary | ICD-10-CM | POA: Diagnosis not present

## 2017-09-24 DIAGNOSIS — R400 Somnolence: Secondary | ICD-10-CM

## 2017-09-24 DIAGNOSIS — Z91130 Patient's unintentional underdosing of medication regimen due to age-related debility: Secondary | ICD-10-CM | POA: Diagnosis not present

## 2017-09-24 DIAGNOSIS — Z9183 Wandering in diseases classified elsewhere: Secondary | ICD-10-CM

## 2017-09-24 DIAGNOSIS — I5032 Chronic diastolic (congestive) heart failure: Secondary | ICD-10-CM | POA: Diagnosis not present

## 2017-09-24 DIAGNOSIS — R6889 Other general symptoms and signs: Secondary | ICD-10-CM | POA: Diagnosis not present

## 2017-09-24 DIAGNOSIS — R404 Transient alteration of awareness: Secondary | ICD-10-CM | POA: Diagnosis not present

## 2017-09-24 DIAGNOSIS — Z9841 Cataract extraction status, right eye: Secondary | ICD-10-CM

## 2017-09-24 DIAGNOSIS — Z882 Allergy status to sulfonamides status: Secondary | ICD-10-CM

## 2017-09-24 DIAGNOSIS — I700 Atherosclerosis of aorta: Secondary | ICD-10-CM | POA: Diagnosis not present

## 2017-09-24 DIAGNOSIS — I4891 Unspecified atrial fibrillation: Secondary | ICD-10-CM | POA: Diagnosis not present

## 2017-09-24 LAB — CBC WITH DIFFERENTIAL/PLATELET
BASOS PCT: 1 %
Basophils Absolute: 0 10*3/uL (ref 0.0–0.1)
EOS ABS: 0.1 10*3/uL (ref 0.0–0.7)
EOS PCT: 1 %
HCT: 38.7 % (ref 36.0–46.0)
HEMOGLOBIN: 12.8 g/dL (ref 12.0–15.0)
Lymphocytes Relative: 24 %
Lymphs Abs: 2.1 10*3/uL (ref 0.7–4.0)
MCH: 32.3 pg (ref 26.0–34.0)
MCHC: 33.1 g/dL (ref 30.0–36.0)
MCV: 97.7 fL (ref 78.0–100.0)
MONOS PCT: 13 %
Monocytes Absolute: 1.1 10*3/uL — ABNORMAL HIGH (ref 0.1–1.0)
NEUTROS PCT: 61 %
Neutro Abs: 5.3 10*3/uL (ref 1.7–7.7)
PLATELETS: 152 10*3/uL (ref 150–400)
RBC: 3.96 MIL/uL (ref 3.87–5.11)
RDW: 14.1 % (ref 11.5–15.5)
WBC: 8.6 10*3/uL (ref 4.0–10.5)

## 2017-09-24 LAB — COMPREHENSIVE METABOLIC PANEL
ALT: 29 U/L (ref 14–54)
AST: 31 U/L (ref 15–41)
Albumin: 3.2 g/dL — ABNORMAL LOW (ref 3.5–5.0)
Alkaline Phosphatase: 75 U/L (ref 38–126)
Anion gap: 8 (ref 5–15)
BUN: 16 mg/dL (ref 6–20)
CALCIUM: 9 mg/dL (ref 8.9–10.3)
CHLORIDE: 105 mmol/L (ref 101–111)
CO2: 26 mmol/L (ref 22–32)
CREATININE: 0.71 mg/dL (ref 0.44–1.00)
GFR calc Af Amer: >60 mL/min (ref >60)
GLUCOSE: 97 mg/dL (ref 65–99)
Potassium: 4.3 mmol/L (ref 3.5–5.1)
Sodium: 139 mmol/L (ref 135–145)
Total Bilirubin: 1 mg/dL (ref 0.3–1.2)
Total Protein: 5.3 g/dL — ABNORMAL LOW (ref 6.5–8.1)

## 2017-09-24 LAB — URINALYSIS, COMPLETE (UACMP) WITH MICROSCOPIC
Bacteria, UA: NONE SEEN
Bilirubin Urine: NEGATIVE
GLUCOSE, UA: NEGATIVE mg/dL
HGB URINE DIPSTICK: NEGATIVE
Ketones, ur: NEGATIVE mg/dL
Leukocytes, UA: NEGATIVE
NITRITE: NEGATIVE
PH: 6 (ref 5.0–8.0)
PROTEIN: NEGATIVE mg/dL
Specific Gravity, Urine: 1.013 (ref 1.005–1.030)
Squamous Epithelial / LPF: NONE SEEN

## 2017-09-24 LAB — TROPONIN I: Troponin I: 0.08 ng/mL (ref <0.03)

## 2017-09-24 LAB — I-STAT TROPONIN, ED: Troponin i, poc: 0.02 ng/mL (ref 0.00–0.08)

## 2017-09-24 LAB — I-STAT CG4 LACTIC ACID, ED: Lactic Acid, Venous: 1.48 mmol/L (ref 0.5–1.9)

## 2017-09-24 LAB — MAGNESIUM: MAGNESIUM: 1.7 mg/dL (ref 1.7–2.4)

## 2017-09-24 MED ORDER — ENOXAPARIN SODIUM 40 MG/0.4ML ~~LOC~~ SOLN
40.0000 mg | SUBCUTANEOUS | Status: DC
  Administered 2017-09-25 – 2017-09-30 (×6): 40 mg via SUBCUTANEOUS
  Filled 2017-09-24 (×6): qty 0.4

## 2017-09-24 MED ORDER — HYDROCORTISONE NA SUCCINATE PF 100 MG IJ SOLR
50.0000 mg | Freq: Once | INTRAMUSCULAR | Status: AC
  Administered 2017-09-24: 50 mg via INTRAVENOUS
  Filled 2017-09-24: qty 2

## 2017-09-24 MED ORDER — AMIODARONE LOAD VIA INFUSION
150.0000 mg | Freq: Once | INTRAVENOUS | Status: AC
  Administered 2017-09-24: 150 mg via INTRAVENOUS
  Filled 2017-09-24: qty 83.34

## 2017-09-24 MED ORDER — SODIUM CHLORIDE 0.9 % IV BOLUS (SEPSIS)
500.0000 mL | Freq: Once | INTRAVENOUS | Status: AC
  Administered 2017-09-24: 500 mL via INTRAVENOUS

## 2017-09-24 MED ORDER — DONEPEZIL HCL 10 MG PO TABS
10.0000 mg | ORAL_TABLET | Freq: Every day | ORAL | Status: DC
  Administered 2017-09-24 – 2017-09-29 (×6): 10 mg via ORAL
  Filled 2017-09-24 (×6): qty 1

## 2017-09-24 MED ORDER — LORAZEPAM 2 MG/ML IJ SOLN
0.2500 mg | Freq: Four times a day (QID) | INTRAMUSCULAR | Status: DC | PRN
  Administered 2017-09-25: 0.25 mg via INTRAVENOUS
  Filled 2017-09-24 (×2): qty 1

## 2017-09-24 MED ORDER — IOPAMIDOL (ISOVUE-300) INJECTION 61%
INTRAVENOUS | Status: AC
  Administered 2017-09-24: 100 mL
  Filled 2017-09-24: qty 100

## 2017-09-24 MED ORDER — ONDANSETRON HCL 4 MG PO TABS: 4.0000 mg | ORAL_TABLET | Freq: Four times a day (QID) | ORAL | Status: DC | PRN

## 2017-09-24 MED ORDER — SODIUM CHLORIDE 0.9 % IV BOLUS (SEPSIS)
1000.0000 mL | Freq: Once | INTRAVENOUS | Status: AC
  Administered 2017-09-24: 1000 mL via INTRAVENOUS

## 2017-09-24 MED ORDER — AMIODARONE HCL IN DEXTROSE 360-4.14 MG/200ML-% IV SOLN
60.0000 mg/h | INTRAVENOUS | Status: DC
  Administered 2017-09-24: 60 mg/h via INTRAVENOUS
  Filled 2017-09-24 (×2): qty 200

## 2017-09-24 MED ORDER — TRAZODONE HCL 50 MG PO TABS
25.0000 mg | ORAL_TABLET | Freq: Every day | ORAL | Status: DC
  Administered 2017-09-24 – 2017-09-27 (×4): 25 mg via ORAL
  Filled 2017-09-24 (×4): qty 1

## 2017-09-24 MED ORDER — SODIUM CHLORIDE 0.9 % IV BOLUS (SEPSIS)
250.0000 mL | Freq: Once | INTRAVENOUS | Status: AC
  Administered 2017-09-24: 250 mL via INTRAVENOUS

## 2017-09-24 MED ORDER — ONDANSETRON HCL 4 MG/2ML IJ SOLN: 4.0000 mg | Freq: Four times a day (QID) | INTRAMUSCULAR | Status: DC | PRN

## 2017-09-24 MED ORDER — ASPIRIN 325 MG PO TABS
325.0000 mg | ORAL_TABLET | Freq: Every day | ORAL | Status: DC
  Administered 2017-09-24 – 2017-09-30 (×7): 325 mg via ORAL
  Filled 2017-09-24 (×7): qty 1

## 2017-09-24 MED ORDER — ACETAMINOPHEN 650 MG RE SUPP
650.0000 mg | Freq: Four times a day (QID) | RECTAL | Status: DC | PRN
Start: 2017-09-24 — End: 2017-09-30

## 2017-09-24 MED ORDER — AMIODARONE HCL IN DEXTROSE 360-4.14 MG/200ML-% IV SOLN: 30.0000 mg/h | INTRAVENOUS | Status: DC

## 2017-09-24 MED ORDER — ACETAMINOPHEN 325 MG PO TABS
650.0000 mg | ORAL_TABLET | Freq: Four times a day (QID) | ORAL | Status: DC | PRN
  Administered 2017-09-26 (×2): 650 mg via ORAL
  Filled 2017-09-24 (×2): qty 2
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
## 2017-09-24 NOTE — Patient Outreach (Signed)
[facility]) Care Management  09/24/2017  CAILEIGH CANCHE Apr 06, 1923 [id number]  Assessment- CSW received return call from [place]. CSW is aware that patient is currently at ED now. CSW provided a lengthy amount of information on patient's situation to APS intake coordinator but was informed that case is not appropriate for APS. However, APS intake coordinator is going to be completing a referral to [place] Social Worker who will assist with LTC placement. CSW was informed that [facility] accepts Medicaid, has memory care and has 10 available bed openings at this time.  Plan-CSW will complete home visit tomorrow with family.  [name], BSW, MSW, [email or web address] Phone: [phone] Fax: [phone]

## 2017-09-24 NOTE — ED Notes (Signed)
  Pt transported to ct 
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
## 2017-09-24 NOTE — ED Notes (Signed)
ED Provider at bedside. 

## 2017-09-24 NOTE — H&P (Addendum)
History and Physical    Beth Ramos LZJ:[id number] DOB: 02/18/1923 DOA: 09/24/2017  Referring MD/NP/PA:   PCP: Lajean Manes, MD   Patient coming from:  The patient is coming from home.  At baseline, pt is partially dependent for most of ADL.  Chief Complaint: Abnormal behavior, lower abdominal pain  HPI: Beth Ramos is a 83 y.o. female with medical history significant of lymphoma in remission, anemia, dementia, PAF not on anticoagulants, depression, anxiety, dCHF, who presents with abnormal behavior and lower abdominal pain.  Per her daughter, pt has been basically feeling abnormally for about one week, which has been progressively getting worse.. She has been sleeping a lot, not eating well, not going to bathroom, threatening to kill her daughter and dog. Pt is more confused than her baseline. She has not taken her diltiazem or Aricept since yesterday. Pt was seen by PCP and give prescription of trazodone. Patient took trazodone, and become more sleepy today. Patient's daughter states that the patient has had urinary frequency for about 2 months. Not sure if patient has any dysuria or burning on urination. Not sure if patient has any chest pain. Patient has mild lower abdominal pain or nausea, but no vomiting or diarrhea noted. She moves all extremities normally. No fever or chills.  ED Course: pt was found to have negative urinalysis, WBC 8.6, lactic acid 1.48, negative troponin, electrolytes renal function okay, chest x-ray negative. CT abdomen/pelvis is negative for acute issues, but showed an changed biliary dilation. CT head is negative for acute intracranial abnormalities. Body temperature 97.4, hypotensive with blood pressure 74/44, A. fib with RVR on EKG, tachypnea, oxygen saturation 93% on room air. Patient is admitted to stepdown as inpatient. Cardiology, Dr. Gypsy Balsam was consulted.  Review of Systems: Could not be reviewed due to agitation, altered mental status and  dementia  Allergy:  Allergies  Allergen Reactions  . Sulfa Antibiotics Nausea And Vomiting    Past Medical History:  Diagnosis Date  . Anemia   . Anxiety   . Depression   . Lymphoma ([place])   . PONV (postoperative nausea and vomiting) 1960    Past Surgical History:  Procedure Laterality Date  . ABDOMINAL HYSTERECTOMY  1960   partial  . APPENDECTOMY  1948  . [place]  . CATARACT EXTRACTION, BILATERAL    . ESOPHAGOGASTRODUODENOSCOPY  11/06/2011   Procedure: ESOPHAGOGASTRODUODENOSCOPY (EGD);  Surgeon: Jeryl Columbia, MD;  Location: [facility];  Service: Endoscopy;  Laterality: N/A;    Social History:  reports that she has never smoked. She has never used smokeless tobacco. She reports that she does not drink alcohol or use drugs.  Family History: No family history on file.   Prior to Admission medications   Medication Sig Start Date End Date Taking? Authorizing Provider  Ascorbic Acid (VITAMIN C) 100 MG tablet Take 100 mg by mouth daily.   Yes [provider]  Cholecalciferol (VITAMIN D PO) Take 1 tablet by mouth daily.   Yes [provider]  diltiazem (CARDIZEM SR) 60 MG 12 hr capsule Take 60 mg by mouth daily.   Yes [provider]  dimenhyDRINATE (DRAMAMINE) 50 MG tablet Take 50 mg by mouth daily as needed for dizziness.   Yes [provider]  donepezil (ARICEPT) 10 MG tablet Take 10 mg by mouth at bedtime.   Yes [provider]  Multiple Vitamin (MULTIVITAMIN WITH MINERALS) TABS tablet Take 1 tablet by mouth daily.   Yes [provider]  traMADol (ULTRAM) 50 MG tablet Take 50 mg by mouth every 8 (eight) hours as needed for moderate pain.   Yes [provider]  traZODone (DESYREL) 50 MG tablet Take 25 mg by mouth at bedtime. 09/23/17  Yes [provider]  vitamin B-12 (CYANOCOBALAMIN) 100 MCG tablet Take 100 mcg by mouth daily.   Yes [provider]  metroNIDAZOLE (FLAGYL) 500  MG tablet Take 1 tablet (500 mg total) by mouth 3 (three) times daily. Patient not taking: Reported on 01/31/2017 12/09/16   Louellen Molder, MD    Physical Exam: Vitals:   09/24/17 1845 09/24/17 1915 09/24/17 1945 09/24/17 2000  BP: 92/70 (!) 75/54 95/65 106/63  Pulse: (!) 105 (!) 43 (!) 54 (!) 117  Resp: 20 18 (!) 24 (!) 23  Temp:      TempSrc:      SpO2: 96% 96% 95% 93%  Weight:      Height:       General: Not in acute distress HEENT:       Eyes: PERRL, EOMI, no scleral icterus.       ENT: No discharge from the ears and nose, no pharynx injection, no tonsillar enlargement.        Neck: No JVD, no bruit, no mass felt. Heme: No neck lymph node enlargement. Cardiac: S1/S2, irregularly irregular rhythm, No murmurs, No gallops or rubs. Respiratory: No rales, wheezing, rhonchi or rubs. GI: Soft, nondistended, nontender, no rebound pain, no organomegaly, BS present. GU: No hematuria Ext: trace pitting leg edema bilaterally. 2+DP/PT pulse bilaterally. Musculoskeletal: No joint deformities, No joint redness or warmth, no limitation of ROM in spin. Skin: No rashes.  Neuro: confused, agitated, aggressive, not follow command, not oriented X3, cranial nerves II-XII grossly intact, moves all extremities normally. Psych: Patient is not psychotic, no suicidal or hemocidal ideation.  Labs on Admission: I have personally reviewed following labs and imaging studies  CBC:  Recent Labs Lab 09/24/17 1623  WBC 8.6  NEUTROABS 5.3  HGB 12.8  HCT 38.7  MCV 97.7  PLT 016   Basic Metabolic Panel:  Recent Labs Lab 09/24/17 1623  NA 139  K 4.3  CL 105  CO2 26  GLUCOSE 97  BUN 16  CREATININE 0.71  CALCIUM 9.0  MG 1.7   GFR: Estimated Creatinine Clearance: 30.3 mL/min (by C-G formula based on SCr of 0.71 mg/dL). Liver Function Tests:  Recent Labs Lab 09/24/17 1623  AST 31  ALT 29  ALKPHOS 75  BILITOT 1.0  PROT 5.3*  ALBUMIN 3.2*   No results for input(s): LIPASE, AMYLASE  in the last 168 hours. No results for input(s): AMMONIA in the last 168 hours. Coagulation Profile: No results for input(s): INR, PROTIME in the last 168 hours. Cardiac Enzymes: No results for input(s): CKTOTAL, CKMB, CKMBINDEX, TROPONINI in the last 168 hours. BNP (last 3 results) No results for input(s): PROBNP in the last 8760 hours. HbA1C: No results for input(s): HGBA1C in the last 72 hours. CBG: No results for input(s): GLUCAP in the last 168 hours. Lipid Profile: No results for input(s): CHOL, HDL, LDLCALC, TRIG, CHOLHDL, LDLDIRECT in the last 72 hours. Thyroid Function Tests: No results for input(s): TSH, T4TOTAL, FREET4, T3FREE, THYROIDAB in the last 72 hours. Anemia Panel: No results for input(s): VITAMINB12, FOLATE, FERRITIN, TIBC, IRON, RETICCTPCT in the last 72 hours. Urine analysis:    Component Value Date/Time   COLORURINE YELLOW 09/24/2017 [place] 09/24/2017 1744   LABSPEC 1.013 09/24/2017  [place] 6.0 09/24/2017 [place] 09/24/2017 [place] 09/24/2017 1744   [place] 09/24/2017 1744   KETONESUR NEGATIVE 09/24/2017 1744   PROTEINUR NEGATIVE 09/24/2017 1744   UROBILINOGEN 1.0 07/12/2012 0518   NITRITE NEGATIVE 09/24/2017 1744   LEUKOCYTESUR NEGATIVE 09/24/2017 1744   Sepsis Labs: @LABRCNTIP (procalcitonin:4,lacticidven:4) )No results found for this or any previous visit (from the past 240 hour(s)).   Radiological Exams on Admission: Ct Head Wo Contrast  Result Date: 09/24/2017 CLINICAL DATA:  83 year old female with altered mental status. EXAM: CT HEAD WITHOUT CONTRAST TECHNIQUE: Contiguous axial images were obtained from the base of the skull through the vertex without intravenous contrast. COMPARISON:  01/31/2017 and prior CTs FINDINGS: Brain: No evidence of acute infarction, hemorrhage, hydrocephalus, extra-axial collection or mass lesion/mass effect. Mild atrophy and moderate chronic  small-vessel white matter ischemic changes again noted. Vascular: Mild intracranial atherosclerotic calcifications noted. Skull: Normal. Negative for fracture or focal lesion. Sinuses/Orbits: A left mastoid effusion is noted without other significant abnormality Other: None IMPRESSION: 1. No evidence of acute intracranial abnormality 2. Atrophy and chronic small-vessel white matter ischemic changes 3. Left mastoid effusion Electronically Signed   By: Margarette Canada M.D.   On: 09/24/2017 18:31   Ct Abdomen Pelvis W Contrast  Result Date: 09/24/2017 CLINICAL DATA:  83 year old female with abdominal and pelvic pain. History of lymphoma. EXAM: CT ABDOMEN AND PELVIS WITH CONTRAST TECHNIQUE: Multidetector CT imaging of the abdomen and pelvis was performed using the standard protocol following bolus administration of intravenous contrast. CONTRAST:  80 cc intravenous Isovue-300 COMPARISON:  12/08/2016 CT FINDINGS: Patient motion artifact decreases sensitivity. Lower chest: No acute abnormality Hepatobiliary: The liver and gallbladder are within normal limits. Biliary dilatation is unchanged with CBD measuring 12 mm. Intrahepatic biliary dilatation is unchanged. Pancreas: Unremarkable Spleen: Unremarkable Adrenals/Urinary Tract: The kidneys, adrenal glands and bladder are unremarkable. Stomach/Bowel: A large hiatal hernia is again noted. Colonic diverticulosis identified without evidence of diverticulitis. No definite bowel wall thickening or inflammatory changes. There is no evidence bowel obstruction. Vascular/Lymphatic: Aortic atherosclerosis. No enlarged abdominal or pelvic lymph nodes. Reproductive: Status post hysterectomy. No adnexal masses. Other: No ascites, focal collection or pneumoperitoneum. Musculoskeletal: No acute abnormality or suspicious focal bony lesion. Degenerative changes within the visualized spine again noted. T12 compression fracture is unchanged. IMPRESSION: 1. No evidence of acute abnormality 2.  Unchanged biliary dilatation 3. Large hiatal hernia 4.  Aortic Atherosclerosis (ICD10-I70.0). Electronically Signed   By: Margarette Canada M.D.   On: 09/24/2017 18:40   Dg Chest Port 1 View  Result Date: 09/24/2017 CLINICAL DATA:  Altered level of consciousness. EXAM: PORTABLE CHEST 1 VIEW COMPARISON:  PA and lateral chest 12/08/2016. Single-view of the chest 07/12/2012. FINDINGS: Lungs are clear. Large hiatal hernia is noted. Heart size is normal. No pneumothorax or pleural effusion. Multiple remote right rib fractures are seen. Atherosclerosis is identified. IMPRESSION: No acute disease. Large hiatal hernia. Atherosclerosis. Electronically Signed   By: Inge Rise M.D.   On: 09/24/2017 16:09     EKG: Independently reviewed.  A fib with RVR, early R-wave progression   Assessment/Plan Principal Problem:   Atrial fibrillation with RVR (HCC) Active Problems:   Paroxysmal a-fib (HCC)   Dementia   Dehydration   Hypotension   Acute metabolic encephalopathy   Chronic diastolic CHF (congestive heart failure) (HCC)   hx PAF and arial fibrillation with RVR ([place]): HR is up to 140s. Pt is hypotensive, cannot to use Cardizem  drip. Cardiology, Dr. Gypsy Balsam was consulted, who recommended IV fluid and IV amiodarone infusion. Patient responded to the treatment, blood pressure improved from 74/44 to SBP 90-100s now. Not sure if patient has any chest pain. CHA2DS2-VASc Score is 4, needs oral anticoagulation, but pt is not on AC, not sure why pt is not on AC, maybe due to dementia and high risk of fall.   - will admit to SDU as inpt - highly appreciated dr. Foye Clock recommendation - Continue amiodarone infusion - cycle CE q6 x3 and repeat EKG in the am  - start ASA - Risk factor stratification: will check FLP and A1C, TSH, free t3 and t4 - 2d echo - will consult to CM, SW and palliative care  Dementia with aggressive behavior: Patient's aggressive behavior likely due to dementia, but potential  differential diagnosis is frontal lobe stroke. Patient does not have focal neurologic findings. Patient cannot tolerate MRI now. CT head is negative for acute intracranial abnormalities. -continue donepezil - when necessary low-dose Ativan 0.25 mg when necessary for aggressive behavior  Hypotension: Likely due to dehydration. Other DD including occult infection, adrenal insufficiency. Urinalysis negative. Chest x-ray negative. -IVF: total of 1.75 L of NS was given -give one dose of Solu Cortef 50 mg 1 -Check cortisol level -f/u Bx and Ux  Chronic diastolic CHF: 2-D echo 8/54/62 showed EF 65-72% with grade 1 diastolic dysfunction. Patient is not taking diuretics at home. Patient has trace leg edema. No respiratory distress. Chest x-ray is normal. Session for seems to be compensated. -Check BNP  Acute metabolic encephalopathy: Likely multifactorial etiology, including dementia, possible delirium, hypoension, A. fib with RVR -Frequent neuro checks   DVT ppx:  SQ Lovenox Code Status: DNR. Pt has aggressive behavior and altered mental status, cannot make decision for herself. I discussed with her daughter, who is the power of attorney. I explained the meaning of CODE STATUS. Per her daughter, patient signed paperwork for DO NOT RESUSCITATE in the PCPs office in the past. Her daughter states that her mother told her that she wanted to be DO NOT RESUSCITATE.  Family Communication: Yes, patient's duaghter at bed side Disposition Plan:  To be determined Consults called:  Card, Dr. Marlou Porch Admission status: SDU/inpation       Date of Service 09/24/2017    Ivor Costa [facility] Pager [phone]  If 7PM-7AM, please contact night-coverage [email or web address] Password [email or web address] 09/24/2017, 9:56 PM

## 2017-09-25 ENCOUNTER — Other Ambulatory Visit: Payer: Self-pay | Admitting: Licensed Clinical Social Worker

## 2017-09-25 ENCOUNTER — Encounter (HOSPITAL_COMMUNITY): Payer: Self-pay | Admitting: *Deleted

## 2017-09-25 ENCOUNTER — Other Ambulatory Visit: Payer: Self-pay

## 2017-09-25 ENCOUNTER — Inpatient Hospital Stay (HOSPITAL_COMMUNITY): Payer: PPO

## 2017-09-25 DIAGNOSIS — R451 Restlessness and agitation: Secondary | ICD-10-CM

## 2017-09-25 DIAGNOSIS — R1030 Lower abdominal pain, unspecified: Secondary | ICD-10-CM

## 2017-09-25 DIAGNOSIS — K449 Diaphragmatic hernia without obstruction or gangrene: Secondary | ICD-10-CM

## 2017-09-25 DIAGNOSIS — I5032 Chronic diastolic (congestive) heart failure: Secondary | ICD-10-CM

## 2017-09-25 DIAGNOSIS — F03918 Unspecified dementia, unspecified severity, with other behavioral disturbance: Secondary | ICD-10-CM

## 2017-09-25 DIAGNOSIS — F0391 Unspecified dementia with behavioral disturbance: Principal | ICD-10-CM

## 2017-09-25 DIAGNOSIS — I480 Paroxysmal atrial fibrillation: Secondary | ICD-10-CM

## 2017-09-25 DIAGNOSIS — Z515 Encounter for palliative care: Secondary | ICD-10-CM

## 2017-09-25 LAB — CBC
HCT: 36.6 % (ref 36.0–46.0)
Hemoglobin: 12 g/dL (ref 12.0–15.0)
MCH: 32.1 pg (ref 26.0–34.0)
MCHC: 32.8 g/dL (ref 30.0–36.0)
MCV: 97.9 fL (ref 78.0–100.0)
Platelets: 164 K/uL (ref 150–400)
RBC: 3.74 MIL/uL — ABNORMAL LOW (ref 3.87–5.11)
RDW: 14.5 % (ref 11.5–15.5)
WBC: 8.3 K/uL (ref 4.0–10.5)

## 2017-09-25 LAB — TSH: TSH: 0.724 u[IU]/mL (ref 0.350–4.500)

## 2017-09-25 LAB — TROPONIN I
TROPONIN I: 0.88 ng/mL — AB (ref <0.03)
Troponin I: 1.09 ng/mL (ref <0.03)

## 2017-09-25 LAB — BASIC METABOLIC PANEL
Anion gap: 6 (ref 5–15)
BUN: 22 mg/dL — AB (ref 6–20)
CHLORIDE: 109 mmol/L (ref 101–111)
CO2: 23 mmol/L (ref 22–32)
Calcium: 8.6 mg/dL — ABNORMAL LOW (ref 8.9–10.3)
Creatinine, Ser: 0.72 mg/dL (ref 0.44–1.00)
GFR calc non Af Amer: >60 mL/min (ref >60)
Glucose, Bld: 81 mg/dL (ref 65–99)
POTASSIUM: 3.9 mmol/L (ref 3.5–5.1)
SODIUM: 138 mmol/L (ref 135–145)

## 2017-09-25 LAB — LIPID PANEL
CHOL/HDL RATIO: 3.9 ratio
Cholesterol: 212 mg/dL — ABNORMAL HIGH (ref 0–200)
HDL: 55 mg/dL (ref >40)
LDL Cholesterol: 148 mg/dL — ABNORMAL HIGH (ref 0–99)
Triglycerides: 43 mg/dL (ref <150)
VLDL: 9 mg/dL (ref 0–40)

## 2017-09-25 LAB — HEMOGLOBIN A1C
HEMOGLOBIN A1C: 5.3 % (ref 4.8–5.6)
MEAN PLASMA GLUCOSE: 105.41 mg/dL

## 2017-09-25 LAB — T4, FREE: FREE T4: 1.16 ng/dL — AB (ref 0.61–1.12)

## 2017-09-25 LAB — URINE CULTURE: Culture: NO GROWTH

## 2017-09-25 LAB — MRSA PCR SCREENING: MRSA by PCR: NEGATIVE

## 2017-09-25 LAB — BRAIN NATRIURETIC PEPTIDE: B Natriuretic Peptide: 421.7 pg/mL — ABNORMAL HIGH (ref 0.0–100.0)

## 2017-09-25 LAB — CORTISOL-AM, BLOOD: CORTISOL - AM: 12.5 ug/dL (ref 6.7–22.6)

## 2017-09-25 MED ORDER — HALOPERIDOL LACTATE 5 MG/ML IJ SOLN
1.0000 mg | Freq: Two times a day (BID) | INTRAMUSCULAR | Status: DC | PRN
  Administered 2017-09-26: 1 mg via INTRAVENOUS
  Filled 2017-09-25: qty 1

## 2017-09-25 MED ORDER — SODIUM CHLORIDE 0.9 % IV SOLN
INTRAVENOUS | Status: DC
  Administered 2017-09-25: 15:00:00 via INTRAVENOUS

## 2017-09-25 MED ORDER — LORAZEPAM 2 MG/ML IJ SOLN
0.5000 mg | Freq: Once | INTRAMUSCULAR | Status: AC
  Administered 2017-09-25: 0.5 mg via INTRAVENOUS
  Filled 2017-09-25: qty 1

## 2017-09-25 MED ORDER — DILTIAZEM HCL ER 60 MG PO CP12
60.0000 mg | ORAL_CAPSULE | Freq: Every day | ORAL | Status: DC
  Administered 2017-09-25 – 2017-09-30 (×6): 60 mg via ORAL
  Filled 2017-09-25 (×7): qty 1

## 2017-09-25 NOTE — Progress Notes (Signed)
   The patient was trying to get out of bed. She could not be persuaded to stay in bed; therefore the echo was not done.  Beth Ramos F 09/25/2017, 1:30 PM

## 2017-09-25 NOTE — Progress Notes (Signed)
Pt admitted to floor from ER.  Pt. yelling at staff and states "I will kill my daughter, I will kill my dog and her dog."  Attempting to assess patient.  Pt states "If you touch me I will kill you."  Pt continues to yell out stating "I will yell and keep you up all night since you put me here." PRN lorazepam given per orders.
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
## 2017-09-25 NOTE — Patient Outreach (Addendum)
  [place] [facility]) Care Management  [facility] Social Work  09/25/2017  Beth Ramos May 19, 1923 [id number]  Assessment: CSW is aware of patient's current hospitalization. CSW will contact family and cancel home visit that was scheduled for today. CSW discussed case with [facility]. CSW completed outreach call to patient's daughter. Daughter answered. Home visit was canceled for today. Daughter reports that she does not feel safe bringing patient back home and will not be able to do so. Daughter states that she is hopeful that the hospital will be able to place patient at a facility as patient will not be able to return back home with her as she is fearful that patient will cause harm to herself or to others. CSW offered emotional support to daughter that she was receptive to.   Plan: CSW will await for discharge and then follow up with family.  Beth Ramos, BSW, MSW, [name] Phone: [phone] Fax: [phone]
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
## 2017-09-25 NOTE — Progress Notes (Signed)
PT Cancellation Note  Patient Details Name: Beth Ramos MRN: [id number] DOB: 06-Apr-1923   Cancelled Treatment:    Reason Eval/Treat Not Completed: Medical issues which prohibited therapy (pt with continued rise in troponin and not yet declining. Not yet appropriate for eval)   [name] B [name] 09/25/2017, 11:15 AM  [name], [place]

## 2017-09-25 NOTE — Progress Notes (Signed)
Pt HR noted to be sustaining 49.  Amiodarone drip discontinued and MD made aware.  MD also made aware of troponin increase from 0.08 to 0.88 with AM labs.  Pt does not complain of pain.

## 2017-09-25 NOTE — Consult Note (Signed)
   [facility] CM Inpatient Consult   09/25/2017  JOZELYNN DANIELSON 19-Jun-1923 [id number]   Patient's status with [facility] Care Management for chronic disease management services was with an urgent outreach from MD office on 09/24/17.  Patient is in the [place]. [facility] staff received a referral for a [facility] CSW and a contact was made for [facility] RN Telephonic request.   Spoke with [facility] staff earlier today. Currently unclear of the patient's disposition but as chart reviewed the family is hopeful for skilled nursing facility with palliative/Hospice care follow up. Patient currently in [facility] unit.  For questions, please contact:  Natividad Brood, RN BSN [facility] Liaison  [phone] business mobile phone Toll free office [phone]

## 2017-09-25 NOTE — Patient Outreach (Signed)
[facility]) Care Management  09/25/2017  Beth Ramos 1923-06-26 [id number]   Telephone Screen  Referral Date: 09/24/17 Referral Source: MD office(Dr. Felipa Eth) Referral Reason: "patient has become combative, threatens family, can't be left alone, needs SW, dtr needs help with placing patient into a home" Insurance: HTA   RN CM notified that patient admitted to the hospital on 09/24/17. RN CM sent in basket message to [facility] hospital liaison advising of admission.     Plan: RN CM will close referral at this time due to admission.   Enzo Montgomery, RN,BSN,CCM [facility] Telephonic Care Management Coordinator Direct Phone: [phone] Toll Free: [phone] Fax: [phone]

## 2017-09-25 NOTE — Progress Notes (Signed)
Pt continues to climb out of bed, pulling at cords and IV.  Pt unable to be redirected.  When placing patient back to bed and staff leaves room patient immediately attempts to get oob and pull at cords.  Alger Memos, NP notified. Orders received.
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
## 2017-09-25 NOTE — Care Management Note (Signed)
Case Management Note  Patient Details  Name: Beth Ramos MRN: [id number] Date of Birth: [birth year]  Subjective/Objective:   Pt admitted with worsening agitation and lower abdominal pain                 Action/Plan:  PTA from home with daughter - pt requires some assistance for ADLs at baseline,  Palliative care consulted and the recommendation is SNF with palliative following however PT eval is still pending - CSW consulted   Expected Discharge Date:  09/27/17               Expected Discharge Plan:  Skilled Nursing Facility  In-House Referral:  Clinical Social Work  Discharge planning Services  CM Consult  Post Acute Care Choice:    Choice offered to:     DME Arranged:    DME Agency:     HH Arranged:    [place] Agency:     Status of Service:     If discussed at [employer], dates discussed:    Additional Comments:  Maryclare Labrador, RN 09/25/2017, 3:19 PM

## 2017-09-25 NOTE — Consult Note (Signed)
Cardiology Consultation:   Patient ID: LENIYAH MARTELL; [id number]; 1923-04-05   Admit date: 09/24/2017 Date of Consult: 09/25/2017  Primary Care Provider: Lajean Manes, MD Primary Cardiologist: Dr Tamala Julian 2012 Primary Electrophysiologist:  n/a   Patient Profile:   NOLA BOTKINS is a 83 y.o. female with a hx of PAF, anxiety, depression, anemia, lymphoma in remission, D-CHF, who is being seen today for the evaluation of atrial fib at the request of Dr Algis Liming.  History of Present Illness:   Ms. Anastos was seen by Dr Tamala Julian in 2012 when she had PAF and elevated cardiac enzymes in the setting of GIB. No evaluation indicated, pt spontaneously converted to SR. Not anticoagulated 2nd GIB.   Ms Nelms was brought to the ER 11/01 with abnormal behavior that had been going on for about a week. Confusion worse than baseline, sleeping more, threatening to kill her daughter and the dog. Her daughter has been living with her for 2 years, but the behavior has recently escalated in aggressiveness.   In the ER, UA was negative, CT negative, pt hypotensive and in rapid atrial fib. IVF and IV amio used for rate control. Pt continued with aggressive behavior overnight, is much calmer now.  Overnight, pt spontaneously converted to SR, sinus brady. Amio gtt d/c'd. Troponin more elevated.   In the room, pt is sleeping quietly. She rouses to stimulus but is not able to answer questions. She responds to simple commands and goes back to sleep.   Information was obtained from chart notes, staff and IM attending.    Past Medical History:  Diagnosis Date  . Anemia   . Anxiety   . Depression   . Lymphoma ([place])   . PONV (postoperative nausea and vomiting) 1960    Past Surgical History:  Procedure Laterality Date  . ABDOMINAL HYSTERECTOMY  1960   partial  . APPENDECTOMY  1948  . [place]  . CATARACT EXTRACTION, BILATERAL    . ESOPHAGOGASTRODUODENOSCOPY  11/06/2011   Procedure:  ESOPHAGOGASTRODUODENOSCOPY (EGD);  Surgeon: Jeryl Columbia, MD;  Location: [facility];  Service: Endoscopy;  Laterality: N/A;     Inpatient Medications: Scheduled Meds: . aspirin  325 mg Oral Daily  . diltiazem  60 mg Oral Daily  . donepezil  10 mg Oral QHS  . enoxaparin (LOVENOX) injection  40 mg Subcutaneous Q24H  . traZODone  25 mg Oral QHS   Continuous Infusions: . amiodarone Stopped (09/25/17 0700)   PRN Meds: acetaminophen **OR** acetaminophen, ondansetron **OR** ondansetron (ZOFRAN) IV Prior to Admission medications   Medication Sig Start Date End Date Taking? Authorizing Provider  Ascorbic Acid (VITAMIN C) 100 MG tablet Take 100 mg by mouth daily.   Yes [provider]  Cholecalciferol (VITAMIN D PO) Take 1 tablet by mouth daily.   Yes [provider]  diltiazem (CARDIZEM SR) 60 MG 12 hr capsule Take 60 mg by mouth daily.   Yes [provider]  dimenhyDRINATE (DRAMAMINE) 50 MG tablet Take 50 mg by mouth daily as needed for dizziness.   Yes [provider]  donepezil (ARICEPT) 10 MG tablet Take 10 mg by mouth at bedtime.   Yes [provider]  Multiple Vitamin (MULTIVITAMIN WITH MINERALS) TABS tablet Take 1 tablet by mouth daily.   Yes [provider]  traMADol (ULTRAM) 50 MG tablet Take 50 mg by mouth every 8 (eight) hours as needed for moderate pain.   Yes [provider]  traZODone (DESYREL) 50 MG  tablet Take 25 mg by mouth at bedtime. 09/23/17  Yes [provider]  vitamin B-12 (CYANOCOBALAMIN) 100 MCG tablet Take 100 mcg by mouth daily.   Yes [provider]    Allergies:    Allergies  Allergen Reactions  . Sulfa Antibiotics Nausea And Vomiting    Social History:   Social History   Social History  . Marital status: Widowed    Spouse name: N/A  . Number of children: N/A  . Years of education: N/A   Occupational History  . Not on file.   Social History Main Topics  . Smoking  status: Never Smoker  . Smokeless tobacco: Never Used  . Alcohol use No  . Drug use: No  . Sexual activity: No   Other Topics Concern  . Not on file   Social History Narrative  . No narrative on file    Family History:   The patient's family history is not on file. Pt indicated that her mother is deceased. She indicated that her father is deceased.    ROS:  Please see the history of present illness.  All other ROS reviewed and negative.      Physical Exam/Data:   Vitals:   09/24/17 2345 09/25/17 0332 09/25/17 0400 09/25/17 0714  BP: (!) 103/54  (!) 112/56 (!) 94/52  Pulse: 63  62 (!) 59  Resp: 17  16 19   Temp:   97.8 F (36.6 C)   TempSrc:   Axillary   SpO2: 97%  96% 97%  Weight:  115 lb 8.3 oz (52.4 kg)    Height:  5\' 2"  (1.575 m)      Intake/Output Summary (Last 24 hours) at 09/25/17 1124 Last data filed at 09/24/17 2251  Gross per 24 hour  Intake          1970.51 ml  Output                0 ml  Net          1970.51 ml   Filed Weights   09/24/17 1515 09/25/17 0332  Weight: 108 lb (49 kg) 115 lb 8.3 oz (52.4 kg)   Body mass index is 21.13 kg/m.  General: Frail elderly female, sleeps unless stimulated HEENT: normal for age Lymph: no adenopathy Neck: no JVD Endocrine:  No thryomegaly Vascular: No carotid bruits; 4/4 extremity pulses 2+  Cardiac:  normal S1, S2; RRR; 2/6 AS murmur  Lungs:  clear to auscultation bilaterally, no wheezing, rhonchi or rales  Abd: soft, nontender, no hepatomegaly  Ext: no edema Musculoskeletal:  No deformities, BUE and BLE strength normal and equal Skin: warm and dry  Neuro: Sleeps unless aroused, no focal abnormalities noted  EKG:  The EKG was personally reviewed and demonstrates: Sinus rhythm, heart rate 62, slight anterolateral T wave inversions are new Telemetry:  Telemetry was personally reviewed and demonstrates: Atrial fib RVR spontaneously converted to sinus rhythm last p.m. since then, sinus rhythm, sinus  bradycardia, no significant ectopy  Relevant CV Studies:  ECHO: Ordered  ECHO: 12/08/2016 - Left ventricle: The cavity size was normal. Wall thickness was   increased in a pattern of mild LVH. Systolic function was   vigorous. The estimated ejection fraction was in the range of 65%   to 70%. Doppler parameters are consistent with abnormal left   ventricular relaxation (grade 1 diastolic dysfunction). - Aortic valve: AV is thickened, calcified with very mildly   restricted motion Peak and mean gradients through the  valve are   19 and 9 mm Hg respectively consistent with mild AS. - Mitral valve: There was mild regurgitation. - Pulmonary arteries: PA peak pressure: 38 mm Hg (S).   Laboratory Data:  Chemistry Recent Labs Lab 09/24/17 1623 09/25/17 0819  NA 139 138  K 4.3 3.9  CL 105 109  CO2 26 23  GLUCOSE 97 81  BUN 16 22*  CREATININE 0.71 0.72  CALCIUM 9.0 8.6*  GFRNONAA >60 >60  GFRAA >60 >60  ANIONGAP 8 6     Recent Labs Lab 09/24/17 1623  PROT 5.3*  ALBUMIN 3.2*  AST 31  ALT 29  ALKPHOS 75  BILITOT 1.0   Hematology Recent Labs Lab 09/24/17 1623 09/25/17 0819  WBC 8.6 8.3  RBC 3.96 3.74*  HGB 12.8 12.0  HCT 38.7 36.6  MCV 97.7 97.9  MCH 32.3 32.1  MCHC 33.1 32.8  RDW 14.1 14.5  PLT 152 164   Cardiac Enzymes Recent Labs Lab 09/24/17 2027 09/25/17 0345 09/25/17 0819  TROPONINI 0.08* 0.88* 1.09*    Recent Labs Lab 09/24/17 1655  TROPIPOC 0.02    BNP Recent Labs Lab 09/25/17 0345  BNP 421.7*    DDimer No results for input(s): DDIMER in the last 168 hours.  Radiology/Studies:  Ct Head Wo Contrast  Result Date: 09/24/2017 CLINICAL DATA:  83 year old female with altered mental status. EXAM: CT HEAD WITHOUT CONTRAST TECHNIQUE: Contiguous axial images were obtained from the base of the skull through the vertex without intravenous contrast. COMPARISON:  01/31/2017 and prior CTs FINDINGS: Brain: No evidence of acute infarction, hemorrhage,  hydrocephalus, extra-axial collection or mass lesion/mass effect. Mild atrophy and moderate chronic small-vessel white matter ischemic changes again noted. Vascular: Mild intracranial atherosclerotic calcifications noted. Skull: Normal. Negative for fracture or focal lesion. Sinuses/Orbits: A left mastoid effusion is noted without other significant abnormality Other: None IMPRESSION: 1. No evidence of acute intracranial abnormality 2. Atrophy and chronic small-vessel white matter ischemic changes 3. Left mastoid effusion Electronically Signed   By: Margarette Canada M.D.   On: 09/24/2017 18:31   Ct Abdomen Pelvis W Contrast  Result Date: 09/24/2017 CLINICAL DATA:  83 year old female with abdominal and pelvic pain. History of lymphoma. EXAM: CT ABDOMEN AND PELVIS WITH CONTRAST TECHNIQUE: Multidetector CT imaging of the abdomen and pelvis was performed using the standard protocol following bolus administration of intravenous contrast. CONTRAST:  80 cc intravenous Isovue-300 COMPARISON:  12/08/2016 CT FINDINGS: Patient motion artifact decreases sensitivity. Lower chest: No acute abnormality Hepatobiliary: The liver and gallbladder are within normal limits. Biliary dilatation is unchanged with CBD measuring 12 mm. Intrahepatic biliary dilatation is unchanged. Pancreas: Unremarkable Spleen: Unremarkable Adrenals/Urinary Tract: The kidneys, adrenal glands and bladder are unremarkable. Stomach/Bowel: A large hiatal hernia is again noted. Colonic diverticulosis identified without evidence of diverticulitis. No definite bowel wall thickening or inflammatory changes. There is no evidence bowel obstruction. Vascular/Lymphatic: Aortic atherosclerosis. No enlarged abdominal or pelvic lymph nodes. Reproductive: Status post hysterectomy. No adnexal masses. Other: No ascites, focal collection or pneumoperitoneum. Musculoskeletal: No acute abnormality or suspicious focal bony lesion. Degenerative changes within the visualized spine  again noted. T12 compression fracture is unchanged. IMPRESSION: 1. No evidence of acute abnormality 2. Unchanged biliary dilatation 3. Large hiatal hernia 4.  Aortic Atherosclerosis (ICD10-I70.0). Electronically Signed   By: Margarette Canada M.D.   On: 09/24/2017 18:40   Dg Chest Port 1 View  Result Date: 09/24/2017 CLINICAL DATA:  Altered level of consciousness. EXAM: PORTABLE CHEST 1 VIEW COMPARISON:  PA and lateral chest 12/08/2016. Single-view of the chest 07/12/2012. FINDINGS: Lungs are clear. Large hiatal hernia is noted. Heart size is normal. No pneumothorax or pleural effusion. Multiple remote right rib fractures are seen. Atherosclerosis is identified. IMPRESSION: No acute disease. Large hiatal hernia. Atherosclerosis. Electronically Signed   By: Inge Rise M.D.   On: 09/24/2017 16:09    Assessment and Plan:   Principal Problem:   Atrial fibrillation with RVR ([place]) -Patient spontaneously converted to sinus rhythm. -Prior to admission, she was on Cardizem SR 120 mg but only taking it once a day.  This has been restarted, agree -Systolic blood pressures in the 90s, not able to increase the Cardizem. -Heart rate is in the 50s, may have to decrease the dose of Cardizem or change to Cardizem CD 120 mg daily versus short acting Cardizem 30 mg every 6 hours.  Will discuss with MD - This patients CHA2DS2-VASc Score and unadjusted Ischemic Stroke Rate (% per year) is equal to 3.2 % stroke rate/year from a score of 3 Above score calculated as 1 point each if present [CHF, HTN, DM, Vascular=MI/PAD/Aortic Plaque, Age if 33-74, or Female], 2 points each if present [Age > 75, or Stroke/TIA/TE] -However, do not think she is an anticoagulation candidate secondary to age and frailty  Otherwise, per IM.  She is a DNR and Palliative Care is seeing her. Active Problems:   Paroxysmal a-fib ([facility])   Dementia   Dehydration   Hypotension   Acute metabolic encephalopathy   Chronic diastolic CHF (congestive  heart failure) ([place])     Signed, Lenoard Aden  09/25/2017 11:24 AM   Attending Note:   The patient was seen and examined.  Agree with assessment and plan as noted above.  Changes made to the above note as needed.  Patient seen and independently examined with Rosaria Ferries, PA .   We discussed all aspects of the encounter. I agree with the assessment and plan as stated above.  1.  Paroxysmal atrial fibrillation: Mrs. Kaman is a 83 year old female with a long history of paroxysmal atrial fibrillation, her biggest problem now is altered mental status.  Is been very difficult for her daughter to take care of her at home because of this altered mental status. She has a history of GI bleed.  She presented with atrial fibrillation and converted fairly quickly with IV amiodarone.  She has severe dementia.  She was able to talk but she thinks that she is at home and wants to go to her kitchen.  She was really not able to participate in any sort of meaningful discussion about her heart issues.  At this point she remained stable.  I do not think that she is a candidate for anticoagulation given her history of GI bleeding in the past.  She also has severe dementia and is a very  high fall risk. She is currently on Cardizem..  she converted quickly with IV amiodarone.  We could consider starting her on low-dose amiodarone if she continues to have episodes of paroxysmal atrial fibrillation.  At present I do not think that it is indicated.  The patient would not cooperate with the echocardiogram technician and so therefore the echo was not performed.  At this point I do not think there is anything further that I would recommend from a cardiology standpoint.  The patient is requiring 24-hour assistance and care to make sure that she does not get up and wander around.  She is in  normal sinus rhythm.  We will sign off. Please call for questions   I have spent a total of 40 minutes with patient  reviewing hospital  notes , telemetry, EKGs, labs and examining patient as well as establishing an assessment and plan that was discussed with the patient. > 50% of time was spent in direct patient care.    Thayer Headings, Brooke Bonito., MD, [facility] 09/25/2017, 1:42 PM [address]  [place] Pager [phone]

## 2017-09-26 ENCOUNTER — Inpatient Hospital Stay (HOSPITAL_COMMUNITY): Payer: PPO

## 2017-09-26 DIAGNOSIS — F0151 Vascular dementia with behavioral disturbance: Secondary | ICD-10-CM

## 2017-09-26 LAB — T3, FREE: T3, Free: 2.1 pg/mL (ref 2.0–4.4)

## 2017-09-26 MED ORDER — CITALOPRAM HYDROBROMIDE 20 MG PO TABS
10.0000 mg | ORAL_TABLET | Freq: Every day | ORAL | Status: DC
  Administered 2017-09-26 – 2017-09-30 (×5): 10 mg via ORAL
  Filled 2017-09-26 (×5): qty 1

## 2017-09-26 NOTE — Progress Notes (Signed)
PROGRESS NOTE   Beth Ramos  STM:[id number]    DOB: 02-Jan-1923    DOA: 09/24/2017  PCP: Lajean Manes, MD   I have briefly reviewed patients previous medical records in [facility].  Brief Narrative:  83 year old female, has been living with her daughter for the last 2 years, PMH of dementia diagnosed approximately 2 years ago and progressively worsening, hard of hearing, PAF not on anticoagulation, chronic diastolic CHF, anxiety, depression, chronic urinary frequency who presented to the ED with progressively worsening abnormal/irrational behavior and abdominal pain. As per discussion with daughter, patient quit/lost interest in activities that she liked to do almost a year ago such as crocheting, reading, knitting etc. In the last 3 months, her mental status has progressively worsened, eats well, sleeps most of the day, awake all night and keeps wandering around in the house, has even attempted to leave the house, keeps talking to herself. In the last few days, has been asking for her dog which died 3 years ago, a baby and accusing daughter that she killed her dog. She has threatened to kill her daughter's dog and wishes that her daughter was dead. In ED, hypotensive 74/44, A. fib with RVR but metabolic workup and sepsis workup negative. Reverted to SB/SR after amiodarone drip. Cardiology consulted. Social worker consulted for nursing home placement (daughter cannot take care of her).  Patient medically stable for discharge to SNF pending PT, OT evaluation and bed availability.   Assessment & Plan:   Principal Problem:   Dementia with behavioral disturbance Active Problems:   Paroxysmal a-fib (HCC)   Dementia   Dehydration   Atrial fibrillation with RVR (HCC)   Hypotension   Acute metabolic encephalopathy   Chronic diastolic CHF (congestive heart failure) (HCC)   Palliative care by specialist   A. fib with RVR - Presented with ventricular rate in the 140s. She was also  hypotensive in the ED. Unable to use Cardizem at that time. Her case was discussed with cardiologist on-call and she was started on IV amiodarone infusion. - She reverted to sinus bradycardia in the 50s-sinus rhythm, overnight on the night of admission. - Restarted home dose of Cardizem CD 60 mg daily (12 hour preparation).  - CHA2DS2-VASc Score is 4. Not a good anticoagulation candidate due to advanced age, progressively worsening dementia, fall risk and noted GI bleed history. - TSH normal. Free T4 mildly elevated probably of no significance. May repeat as outpatient.  Patient not cooperative to perform 2D echo. - EKG 11/2 personally reviewed: Sinus rhythm at 63 bpm, normal axis, T-wave inversion in leads V1-4, no other acute changes and QTC 405 ms. -Cardiology saw and signed off. -Remains in sinus rhythm.  No further workup.  Transferred to medical bed.  Progressive dementia with behavioral abnormalities  - Suspect that her irrational/abnormal behavior and aggressiveness is due to progressively worsening dementia. No obvious metabolic or infectious cause noted. CT head without acute abnormalities. Low index of suspicion for acute stroke. No current pain issues. Hard of hearing may be contributing. Disturbed sleep rhythm may also be contributing. - As per daughter, recently started on bedtime trazodone but has only taken one dose. - Attempt to normalize sleep rhythm, minimize opioids and benzodiazepines, treat pain with acetaminophen, hearing aids and consider when necessary Haldol or Seroquel. Continue Aricept. - Daughter indicates that she will not be able to manage patient's care at home due to her worsening mental status changes and daughter works. Clinical social worker consulted. -Psychiatric consultation  appreciated.  Start Celexa 10 mg daily.  Not agitated this morning.  Elevated troponin - Likely demand ischemia due to hypotension and A. fib with RVR. No chest pain reported. May not be a  candidate for aggressive intervention and hence ischemic evaluation.  Not cooperative to perform 2D echo.  Hypotension - May be due to dehydration and A. fib with RVR. Improved but blood pressures remain soft. Monitor closely.  - Cortisol 12.5. -Resolved.  Discontinue IV fluids.  Chronic diastolic CHF 2-D echo 12/23/84: LVEF 65-70 percent and grade 1 diastolic dysfunction.  Compensated.  Anxiety & depression Does not seem to be on medications at home for this. TSH normal.  Reported abdominal pain -Resolved. Urine microscopy negative. CT abdomen without acute findings.  Large hiatal hernia Seen on imaging  Aortic atherosclerosis Seen on chest x-ray and CT abdomen. Continue aspirin.  Urinary frequency - ? Overactive bladder. UA negative.  Urine culture negative.  Blood cultures x2: Negative to date.  Suspected fall On 09/26/17 morning, RN reported that patient was seen sitting on the mat next to her low bed and suspicious for fall because bed rails were up.  No injuries noted.  Hemodynamically and physical exam stable.  As discussed with RN, fall precautions and [employer] cabin [employer].  PT and OT evaluation pending.   DVT prophylaxis: Lovenox  Code Status: DO NOT RESUSCITATE  Family Communication: Discussed in detail with patient's daughter via phone on 11/2. Updated care and answered questions.  None at bedside today. Disposition: Likely will need nursing home placement pending PT and/or OT evaluation.  Consultants:  Cardiology   Procedures:  None   Antimicrobials:  None    Subjective: Patient states that she fell outside the door.  Denies injuries or pain.  Denies any other complaints.  No chest pain, dyspnea or palpitations reported.  As per RN, suspected fall as indicated above.  ROS: Rest of history as discussed with daughter, in brief narrative above.   Objective:  Vitals:   09/25/17 1937 09/25/17 2000 09/26/17 0000 09/26/17 0400  BP: (!) 131/59 (!) 118/54 (!) 115/59  137/65  Pulse: 62 64 63 64  Resp: 15 15 17 18   Temp: 97.8 F (36.6 C)  97.6 F (36.4 C) (!) 97.4 F (36.3 C)  TempSrc: Oral  Oral Oral  SpO2: 95% 95% 94% 95%  Weight:      Height:        Examination:  General exam: Pleasant elderly female, moderately built and thinly nourished, lying comfortably supine in bed.  Looks much improved compared to yesterday.  Oral mucosa moist. Respiratory system: Clear to auscultation. Respiratory effort normal. Cardiovascular system: S1 & S2 heard, RRR. No JVD, murmurs, rubs, gallops or clicks. No pedal edema. Telemetry: She presented with A. fib with RVR in the 140s and reverted to SB in the 50s-SR at 10 PM on 09/24/17.  Remains in sinus rhythm. Gastrointestinal system: Abdomen is nondistended, soft and nontender. No organomegaly or masses felt. Normal bowel sounds heard. Central nervous system: Alert and oriented to self and place only.  No focal neurological deficits. Extremities:Moving all limbs symmetrically.  Skin: No rashes, lesions or ulcers Psychiatry: Judgement and insight impaired.     Data Reviewed: I have personally reviewed following labs and imaging studies  CBC:  Recent Labs Lab 09/24/17 1623 09/25/17 0819  WBC 8.6 8.3  NEUTROABS 5.3  --   HGB 12.8 12.0  HCT 38.7 36.6  MCV 97.7 97.9  PLT 152 578   Basic Metabolic  Panel:  Recent Labs Lab 09/24/17 1623 09/25/17 0819  NA 139 138  K 4.3 3.9  CL 105 109  CO2 26 23  GLUCOSE 97 81  BUN 16 22*  CREATININE 0.71 0.72  CALCIUM 9.0 8.6*  MG 1.7  --    Liver Function Tests:  Recent Labs Lab 09/24/17 1623  AST 31  ALT 29  ALKPHOS 75  BILITOT 1.0  PROT 5.3*  ALBUMIN 3.2*   Cardiac Enzymes:  Recent Labs Lab 09/24/17 2027 09/25/17 0345 09/25/17 0819  TROPONINI 0.08* 0.88* 1.09*   HbA1C:  Recent Labs  09/25/17 0345  HGBA1C 5.3   CBG: No results for input(s): GLUCAP in the last 168 hours.  Recent Results (from the past 240 hour(s))  Urine culture      Status: None   Collection Time: 09/24/17  5:54 PM  Result Value Ref Range Status   Specimen Description IN/OUT CATH URINE  Final   Special Requests NONE  Final   Culture NO GROWTH  Final   Report Status 09/25/2017 FINAL  Final  Culture, blood (Routine X 2) w Reflex to ID Panel     Status: None (Preliminary result)   Collection Time: 09/24/17  6:41 PM  Result Value Ref Range Status   Specimen Description BLOOD BLOOD LEFT FOREARM  Final   Special Requests   Final    BOTTLES DRAWN AEROBIC AND ANAEROBIC Blood Culture adequate volume   Culture NO GROWTH < 24 HOURS  Final   Report Status PENDING  Incomplete  Culture, blood (Routine X 2) w Reflex to ID Panel     Status: None (Preliminary result)   Collection Time: 09/24/17  6:55 PM  Result Value Ref Range Status   Specimen Description BLOOD LEFT HAND  Final   Special Requests   Final    BOTTLES DRAWN AEROBIC AND ANAEROBIC Blood Culture adequate volume   Culture NO GROWTH < 24 HOURS  Final   Report Status PENDING  Incomplete  MRSA PCR Screening     Status: None   Collection Time: 09/25/17  1:07 AM  Result Value Ref Range Status   MRSA by PCR NEGATIVE NEGATIVE Final    Comment:        The GeneXpert MRSA Assay (FDA approved for NASAL specimens only), is one component of a comprehensive MRSA colonization surveillance program. It is not intended to diagnose MRSA infection nor to guide or monitor treatment for MRSA infections.          Radiology Studies: Ct Head Wo Contrast  Result Date: 09/24/2017 CLINICAL DATA:  82 year old female with altered mental status. EXAM: CT HEAD WITHOUT CONTRAST TECHNIQUE: Contiguous axial images were obtained from the base of the skull through the vertex without intravenous contrast. COMPARISON:  01/31/2017 and prior CTs FINDINGS: Brain: No evidence of acute infarction, hemorrhage, hydrocephalus, extra-axial collection or mass lesion/mass effect. Mild atrophy and moderate chronic small-vessel white  matter ischemic changes again noted. Vascular: Mild intracranial atherosclerotic calcifications noted. Skull: Normal. Negative for fracture or focal lesion. Sinuses/Orbits: A left mastoid effusion is noted without other significant abnormality Other: None IMPRESSION: 1. No evidence of acute intracranial abnormality 2. Atrophy and chronic small-vessel white matter ischemic changes 3. Left mastoid effusion Electronically Signed   By: Margarette Canada M.D.   On: 09/24/2017 18:31   Ct Abdomen Pelvis W Contrast  Result Date: 09/24/2017 CLINICAL DATA:  83 year old female with abdominal and pelvic pain. History of lymphoma. EXAM: CT ABDOMEN AND PELVIS WITH CONTRAST TECHNIQUE: Multidetector  CT imaging of the abdomen and pelvis was performed using the standard protocol following bolus administration of intravenous contrast. CONTRAST:  80 cc intravenous Isovue-300 COMPARISON:  12/08/2016 CT FINDINGS: Patient motion artifact decreases sensitivity. Lower chest: No acute abnormality Hepatobiliary: The liver and gallbladder are within normal limits. Biliary dilatation is unchanged with CBD measuring 12 mm. Intrahepatic biliary dilatation is unchanged. Pancreas: Unremarkable Spleen: Unremarkable Adrenals/Urinary Tract: The kidneys, adrenal glands and bladder are unremarkable. Stomach/Bowel: A large hiatal hernia is again noted. Colonic diverticulosis identified without evidence of diverticulitis. No definite bowel wall thickening or inflammatory changes. There is no evidence bowel obstruction. Vascular/Lymphatic: Aortic atherosclerosis. No enlarged abdominal or pelvic lymph nodes. Reproductive: Status post hysterectomy. No adnexal masses. Other: No ascites, focal collection or pneumoperitoneum. Musculoskeletal: No acute abnormality or suspicious focal bony lesion. Degenerative changes within the visualized spine again noted. T12 compression fracture is unchanged. IMPRESSION: 1. No evidence of acute abnormality 2. Unchanged biliary  dilatation 3. Large hiatal hernia 4.  Aortic Atherosclerosis (ICD10-I70.0). Electronically Signed   By: Margarette Canada M.D.   On: 09/24/2017 18:40   Dg Chest Port 1 View  Result Date: 09/24/2017 CLINICAL DATA:  Altered level of consciousness. EXAM: PORTABLE CHEST 1 VIEW COMPARISON:  PA and lateral chest 12/08/2016. Single-view of the chest 07/12/2012. FINDINGS: Lungs are clear. Large hiatal hernia is noted. Heart size is normal. No pneumothorax or pleural effusion. Multiple remote right rib fractures are seen. Atherosclerosis is identified. IMPRESSION: No acute disease. Large hiatal hernia. Atherosclerosis. Electronically Signed   By: Inge Rise M.D.   On: 09/24/2017 16:09        Scheduled Meds: . aspirin  325 mg Oral Daily  . citalopram  10 mg Oral Daily  . diltiazem  60 mg Oral Daily  . donepezil  10 mg Oral QHS  . enoxaparin (LOVENOX) injection  40 mg Subcutaneous Q24H  . traZODone  25 mg Oral QHS   Continuous Infusions:    LOS: 2 days     Daquon Greenleaf, MD, FACP, FHM. [facility] Pager [phone]  If 7PM-7AM, please contact night-coverage [email or web address] Password [email or web address] 09/26/2017, 7:58 AM

## 2017-09-26 NOTE — Progress Notes (Signed)
Called daughter [name] and left message letting her know which unit and room pt was going to transfer to.

## 2017-09-26 NOTE — Progress Notes (Signed)
Attempted report, waiting for call back from RN

## 2017-09-26 NOTE — Evaluation (Signed)
Physical Therapy Evaluation Patient Details Name: Beth Ramos MRN: [id number] DOB: 11-28-1922 Today's Date: 09/26/2017   History of Present Illness  83 year old female, has been living with her daughter for the last 2 years, PMH of dementia diagnosed approximately 2 years ago and progressively worsening, hard of hearing, PAF not on anticoagulation, chronic diastolic CHF, anxiety, depression, chronic urinary frequency who presented to the ED with progressively worsening abnormal/irrational behavior and abdominal pain. As per discussion with daughter, patient quit/lost interest in activities that she liked to do almost a year ago such as crocheting, reading, knitting etc. In the last 3 months, her mental status has progressively worsened, eats well, sleeps most of the day, awake all night and keeps wandering around in the house, has even attempted to leave the house, keeps talking to herself. In the last few days, has been asking for her dog which died 3 years ago, a baby and accusing daughter that she killed her dog. She has threatened to kill her daughter's dog and wishes that her daughter was dead. In ED, hypotensive 74/44, A. fib with RVR but metabolic workup and sepsis workup negative. Reverted to SB/SR after amiodarone drip.  Clinical Impression  Pt admitted with above diagnosis. Pt currently with functional limitations due to the deficits listed below (see PT Problem List). Pt was able to ambulate with RW with mod assist and cues.  Confusion and poor safety awareness limit pts' function. Will follow acutely.   Pt will benefit from skilled PT to increase their independence and safety with mobility to allow discharge to the venue listed below.    Follow Up Recommendations SNF;Supervision/Assistance - 24 hour    Equipment Recommendations  None recommended by PT    Recommendations for Other Services       Precautions / Restrictions Precautions Precautions: Fall Restrictions Weight Bearing  Restrictions: No      Mobility  Bed Mobility Overal bed mobility: Independent             General bed mobility comments: impulsive with movement decreasing safety.   Transfers Overall transfer level: Needs assistance Equipment used: Rolling walker (2 wheeled) Transfers: Sit to/from Stand Sit to Stand: Mod assist         General transfer comment: Pt stood to RW but then with LOB needing mod assist to recover. Very unsteady on her feet.   Ambulation/Gait Ambulation/Gait assistance: Mod assist;Min assist Ambulation Distance (Feet): 300 Feet Assistive device: Rolling walker (2 wheeled) Gait Pattern/deviations: Step-through pattern;Decreased stride length;Trunk flexed;Wide base of support;Leaning posteriorly;Drifts right/left   Gait velocity interpretation: Below normal speed for age/gender General Gait Details: Pt needed cues to stay close to RW constantly. She tends to let RW get away from her.  NEeds cues to sequence steps and RW.  Pt was able to ambulate with RW with steadying assist at times as pt with poor safety awareness.  Constant cues to slow down.  Held onto RW at times as pt pushes it out too far.   Stairs            Wheelchair Mobility    Modified Rankin (Stroke Patients Only)       Balance Overall balance assessment: Needs assistance;History of Falls Sitting-balance support: No upper extremity supported;Feet supported Sitting balance-Leahy Scale: Fair     Standing balance support: Bilateral upper extremity supported;During functional activity Standing balance-Leahy Scale: Poor Standing balance comment: relies on UE support for balance  Pertinent Vitals/Pain Pain Assessment: No/denies pain   91-95% on RA, BP and HR stable.  Home Living Family/patient expects to be discharged to:: Skilled nursing facility Living Arrangements: Children               Additional Comments: dau works at church directly  across the street, checks on pt every 1-2 hrs.  Cannot provide the help pt needs any longer    Prior Function     Gait / Transfers Assistance Needed: Has been needing incr help due to decline in balance  ADL's / Homemaking Assistance Needed: has been needing incr assist   Comments: sponge bathes, independent with ADLs and simple meal prep     Hand Dominance   Dominant Hand: Right    Extremity/Trunk Assessment   Upper Extremity Assessment Upper Extremity Assessment: Defer to OT evaluation    Lower Extremity Assessment Lower Extremity Assessment: Generalized weakness    Cervical / Trunk Assessment Cervical / Trunk Assessment: Kyphotic  Communication   Communication: No difficulties;HOH  Cognition Arousal/Alertness: Awake/alert Behavior During Therapy: Flat affect Overall Cognitive Status: History of cognitive impairments - at baseline                                 General Comments: Pt confused.  Not oriented to place time or situation.  Poor safety awareness.       General Comments      Exercises     Assessment/Plan    PT Assessment Patient needs continued PT services  PT Problem List Decreased activity tolerance;Decreased balance;Decreased mobility;Decreased knowledge of use of DME;Decreased safety awareness;Decreased knowledge of precautions       PT Treatment Interventions DME instruction;Gait training;Functional mobility training;Therapeutic activities;Therapeutic exercise;Balance training;Patient/family education    PT Goals (Current goals can be found in the Care Plan section)  Acute Rehab PT Goals Patient Stated Goal: unable to state PT Goal Formulation: With patient Time For Goal Achievement: 10/10/17 Potential to Achieve Goals: Fair    Frequency Min 3X/week   Barriers to discharge Decreased caregiver support (daughter can no longer care for pt)      Co-evaluation               AM-PAC PT "6 Clicks" Daily Activity   Outcome Measure Difficulty turning over in bed (including adjusting bedclothes, sheets and blankets)?: None Difficulty moving from lying on back to sitting on the side of the bed? : None Difficulty sitting down on and standing up from a chair with arms (e.g., wheelchair, bedside commode, etc,.)?: A Lot Help needed moving to and from a bed to chair (including a wheelchair)?: A Lot Help needed walking in hospital room?: A Lot Help needed climbing 3-5 steps with a railing? : Total 6 Click Score: 15    End of Session Equipment Utilized During Treatment: Gait belt Activity Tolerance: Patient limited by fatigue Patient left: in chair;with call bell/phone within reach;with chair alarm set;with nursing/sitter in room Nurse Communication: Mobility status PT Visit Diagnosis: Unsteadiness on feet (R26.81);Muscle weakness (generalized) (M62.81)    Time: 4174-0814 PT Time Calculation (min) (ACUTE ONLY): 13 min   Charges:   PT Evaluation $PT Eval Moderate Complexity: 1 Mod     PT G Codes:        Shamone Winzer,PT Acute Rehabilitation [phone] (pager)   Denice Paradise 09/26/2017, 2:43 PM

## 2017-09-26 NOTE — Progress Notes (Signed)
Pt ambulated in the hall then to chair with PT. Pt was using a walker. Chair alarm on, NT sitting with pt.

## 2017-09-26 NOTE — Progress Notes (Signed)
No available sitter at this time. Pt very impulsive and agitated, combative at times. Pt trying to get up from bed stating she is going to go home. Very impulsive but with generalized weakness. PRN med given for safety.

## 2017-09-26 NOTE — Progress Notes (Signed)
When I entered pt's room at 0700 with pm nurse to do bedside report pt was out of bed sitting on floor mat beside her bed. She stated she fell. Side rail was up, pt was in a low bed with mats on both sides of bed. Pt had yellow arm band and socks on. Bed alarm was not on. Pt had a sitter until 0300 until they were pulled to another unit. MD called and came to bedside, pt's daughter notified. Pt's left forearm IV came out. Pt states she was ok and not hurt. Pt was able to stand up and get back to bed. No new bruising noticed. Floor NT pulled to sit in pt's room and bed alarm turned on. Safety zone filled out and director notified. Unit fall huddle performed.

## 2017-09-26 NOTE — Progress Notes (Signed)
Transferred from [facility] via low bed. Alert, responding verbally with some confusions. Floor mat both sides in place. Bedside commode at bedside. No apparent distress

## 2017-09-26 NOTE — Progress Notes (Signed)
Gave report to RN on 6N, will transfer in low bed

## 2017-09-26 NOTE — Progress Notes (Signed)
OT Cancellation Note  Patient Details Name: Beth Ramos MRN: [id number] DOB: November 22, 1923   Cancelled Treatment:    Reason Eval/Treat Not Completed: Other (comment) (Defer to next venue of care). After consulting with PT, It is appropriate to defer to next venue of care for eval of occupational therapy needs as the Pt has baseline cognition deficits and eval would be more appropriate for that environment.   Merri Ray Elida Harbin 09/26/2017, 1:40 PM  Hulda Humphrey OTR/L [phone]

## 2017-09-27 ENCOUNTER — Other Ambulatory Visit: Payer: Self-pay

## 2017-09-27 MED ORDER — QUETIAPINE 12.5 MG HALF TABLET
12.5000 mg | ORAL_TABLET | Freq: Every day | ORAL | Status: DC
  Administered 2017-09-27 – 2017-09-29 (×3): 12.5 mg via ORAL
  Filled 2017-09-27 (×4): qty 1

## 2017-09-27 NOTE — NC FL2 (Signed)
[place] LEVEL OF CARE SCREENING TOOL     IDENTIFICATION  Patient Name: Beth Ramos Birthdate: Aug 14, 1923 Sex: female Admission Date (Current Location): 09/24/2017  [facility] and [place] Number:  Herbalist and Address:  [facility] [place] [place] [address], [place], [place] [address]      Provider Number: [id number]  Attending Physician Name and Address:  Modena Jansky, MD  Relative Name and Phone Number:       Current Level of Care: Hospital Recommended Level of Care: [place] Prior Approval Number:    Date Approved/Denied:   PASRR Number: [id number]  Discharge Plan: SNF    Current Diagnoses: Patient Active Problem List   Diagnosis Date Noted  . Dementia with behavioral disturbance 09/25/2017  . Palliative care by specialist   . Atrial fibrillation with RVR ([place]) 09/24/2017  . Hypotension 09/24/2017  . Acute metabolic encephalopathy 37/90/2409  . Chronic diastolic CHF (congestive heart failure) ([place]) 09/24/2017  . Vasovagal syncope 12/08/2016  . Dementia 12/08/2016  . Dehydration 12/08/2016  . Colitis 12/08/2016  . Thoracic compression fracture ([place]) 12/08/2016  . Pelvic fracture ([place]) 07/11/2012  . H/O: depression 07/11/2012  . Paroxysmal a-fib ([place]) 11/06/2011  . Hypokalemia 11/06/2011  . Leukocytosis 11/03/2011  . Tachycardia 11/03/2011  . Weakness generalized 11/03/2011    Orientation RESPIRATION BLADDER Height & Weight     (Disoriented x 4)  Normal Continent Weight: 115 lb 8.3 oz (52.4 kg) Height:  5\' 2"  (157.5 cm)  BEHAVIORAL SYMPTOMS/MOOD NEUROLOGICAL BOWEL NUTRITION STATUS  Verbally abusive, Physically abusive (Dementia with behavioral disturbance) Incontinent Diet(Regular)  AMBULATORY STATUS COMMUNICATION OF NEEDS Skin   Limited Assist Verbally Bruising                       Personal Care Assistance Level of Assistance  Bathing, Feeding, Dressing Bathing Assistance: Limited  assistance Feeding assistance: Limited assistance Dressing Assistance: Limited assistance     Functional Limitations Info  Sight, Hearing, Speech Sight Info: Adequate Hearing Info: Adequate Speech Info: Adequate    SPECIAL CARE FACTORS FREQUENCY  PT (By licensed PT), OT (By licensed OT), Blood pressure     PT Frequency: 5 x week OT Frequency: 5 x week            Contractures Contractures Info: Not present    Additional Factors Info  Code Status, Allergies, Psychotropic Code Status Info: DNR Allergies Info: Sulfa Antibiotics Psychotropic Info: Celexa 10 mg PO daily, Trazodone 25 mg PO QHS, Haldol injection 1 mg IV every 12 hours prn, Ativan 0.25 mg IV every 6 hours prn.         Current Medications (09/27/2017):  This is the current hospital active medication list Current Facility-Administered Medications  Medication Dose Route Frequency Provider Last Rate Last Dose  . acetaminophen (TYLENOL) tablet 650 mg  650 mg Oral Q6H PRN Ivor Costa, MD   650 mg at 09/26/17 2005   Or  . acetaminophen (TYLENOL) suppository 650 mg  650 mg Rectal Q6H PRN Ivor Costa, MD      . aspirin tablet 325 mg  325 mg Oral Daily Ivor Costa, MD   325 mg at 09/27/17 1003  . citalopram (CELEXA) tablet 10 mg  10 mg Oral Daily Modena Jansky, MD   10 mg at 09/27/17 1003  . diltiazem (CARDIZEM SR) 12 hr capsule 60 mg  60 mg Oral Daily Hongalgi, Lenis Dickinson, MD   60 mg at  09/27/17 1003  . donepezil (ARICEPT) tablet 10 mg  10 mg Oral QHS Ivor Costa, MD   10 mg at 09/26/17 2002  . enoxaparin (LOVENOX) injection 40 mg  40 mg Subcutaneous Q24H Ivor Costa, MD   40 mg at 09/27/17 1003  . haloperidol lactate (HALDOL) injection 1 mg  1 mg Intravenous Q12H PRN Modena Jansky, MD   1 mg at 09/26/17 2110  . ondansetron (ZOFRAN) tablet 4 mg  4 mg Oral Q6H PRN Ivor Costa, MD       Or  . ondansetron [facility]) injection 4 mg  4 mg Intravenous Q6H PRN Ivor Costa, MD      . traZODone (DESYREL) tablet 25 mg  25 mg Oral QHS  Ivor Costa, MD   25 mg at 09/26/17 2002     Discharge Medications: Please see discharge summary for a list of discharge medications.  Relevant Imaging Results:  Relevant Lab Results:   Additional Information SS#: [id number]. Has a telesitter. Will be without for 24 hours prior to discharge to SNF. Encouraged daughter to apply for Medicaid.  Candie Chroman, LCSW

## 2017-09-27 NOTE — Progress Notes (Signed)
PROGRESS NOTE   Beth Ramos  FXT:[id number]    DOB: 1923/03/27    DOA: 09/24/2017  PCP: Lajean Manes, MD   I have briefly reviewed patients previous [facility].  Brief Narrative:  83 year old female, has been living with her daughter for the last 2 years, PMH of dementia diagnosed approximately 2 years ago and progressively worsening, hard of hearing, PAF not on anticoagulation, chronic diastolic CHF, anxiety, depression, chronic urinary frequency who presented to the ED with progressively worsening abnormal/irrational behavior and abdominal pain. As per discussion with daughter, patient quit/lost interest in activities that she liked to do almost a year ago such as crocheting, reading, knitting etc. In the last 3 months, her mental status has progressively worsened, eats well, sleeps most of the day, awake all night and keeps wandering around in the house, has even attempted to leave the house, keeps talking to herself. In the last few days, has been asking for her dog which died 3 years ago, a baby and accusing daughter that she killed her dog. She has threatened to kill her daughter's dog and wishes that her daughter was dead. In ED, hypotensive 74/44, A. fib with RVR but metabolic workup and sepsis workup negative. Reverted to SB/SR after amiodarone drip. Cardiology consulted. Social worker consulted for nursing home placement (daughter cannot take care of her).  Patient medically stable for discharge to SNF pending PT, OT evaluation and bed availability.   Assessment & Plan:   Principal Problem:   Dementia with behavioral disturbance Active Problems:   Paroxysmal a-fib (HCC)   Dementia   Dehydration   Atrial fibrillation with RVR (HCC)   Hypotension   Acute metabolic encephalopathy   Chronic diastolic CHF (congestive heart failure) (HCC)   Palliative care by specialist   A. fib with RVR - Presented with ventricular rate in the 140s. She was also  hypotensive in the ED. Unable to use Cardizem at that time. Her case was discussed with cardiologist on-call and she was started on IV amiodarone infusion. - She reverted to sinus bradycardia in the 50s-sinus rhythm, overnight on the night of admission. - Restarted home dose of Cardizem CD 60 mg daily (12 hour preparation).  - CHA2DS2-VASc Score is 4. Not a good anticoagulation candidate due to advanced age, progressively worsening dementia, fall risk and noted GI bleed history. - TSH normal. Free T4 mildly elevated probably of no significance. May repeat as outpatient.  Patient not cooperative to perform 2D echo. - EKG 11/2 personally reviewed: Sinus rhythm at 63 bpm, normal axis, T-wave inversion in leads V1-4, no other acute changes and QTC 405 ms. -Cardiology saw and signed off. -Remains in sinus rhythm.  No further workup.  Transferred to medical bed.  Progressive dementia with behavioral abnormalities  - Suspect that her irrational/abnormal behavior and aggressiveness is due to progressively worsening dementia. No obvious metabolic or infectious cause noted. CT head without acute abnormalities. Low index of suspicion for acute stroke. No current pain issues. Hard of hearing may be contributing. Disturbed sleep rhythm may also be contributing. - As per daughter, recently started on bedtime trazodone but has only taken one dose. - Attempt to normalize sleep rhythm, minimize opioids and benzodiazepines, treat pain with acetaminophen, hearing aids and consider when necessary Haldol or Seroquel. Continue Aricept. - Daughter indicates that she will not be able to manage patient's care at home due to her worsening mental status changes and daughter works. Clinical social worker consulted. -Psychiatric consultation  appreciated.  Started Celexa 10 mg daily.  Ongoing issues with agitation mostly at nights.  Start Seroquel 12.5 mg at bedtime.  Elevated troponin - Likely demand ischemia due to hypotension  and A. fib with RVR. No chest pain reported. May not be a candidate for aggressive intervention and hence ischemic evaluation.  Not cooperative to perform 2D echo.  Hypotension - May be due to dehydration and A. fib with RVR. Improved but blood pressures remain soft. Monitor closely.  - Cortisol 12.5. -Resolved.  Discontinue IV fluids.  Chronic diastolic CHF 2-D echo 7/51/70: LVEF 65-70 percent and grade 1 diastolic dysfunction.  Compensated.  Anxiety & depression Does not seem to be on medications at home for this. TSH normal.  Reported abdominal pain -Resolved. Urine microscopy negative. CT abdomen without acute findings.  Large hiatal hernia Seen on imaging  Aortic atherosclerosis Seen on chest x-ray and CT abdomen. Continue aspirin.  Urinary frequency - ? Overactive bladder. UA negative.  Urine culture negative.  Blood cultures x2: Negative to date.  Suspected fall On 09/26/17 morning, RN reported that patient was seen sitting on the mat next to her low bed and suspicious for fall because bed rails were up.  No injuries noted.  Hemodynamically and physical exam stable.  As discussed with RN, fall precautions and Air cabin crew.  PT and OT recommend SNF.   DVT prophylaxis: Lovenox  Code Status: DO NOT RESUSCITATE  Family Communication: Discussed in detail with patient's daughter via phone on 11/2. Updated care and answered questions.  None at bedside today. Disposition: Likely will need nursing home placement pending PT and/or OT evaluation.  Consultants:  Cardiology   Procedures:  None   Antimicrobials:  None    Subjective: Overnight events noted.  Agitation, impulsiveness.  Pleasantly confused this morning.  As per RN, no acute issues reported.  ROS: Rest of history as discussed with daughter, in brief narrative above.   Objective:  Vitals:   09/26/17 1435 09/26/17 2057 09/27/17 0645 09/27/17 1337  BP: 135/80 133/62 128/60 (!) 115/47  Pulse: 67 75 65 67    Resp:  17 16 15   Temp: (!) 97.2 F (36.2 C) 98.7 F (37.1 C) 98.4 F (36.9 C) 98 F (36.7 C)  TempSrc: Oral Oral  Oral  SpO2: 95% 94% 93% 94%  Weight:      Height:        Examination:  General exam: Pleasant elderly female, moderately built and thinly nourished, lying comfortably supine in bed.  Oral mucosa moist.  Does not appear in any distress.  Not as cooperative as yesterday to exam. Respiratory system: Clear to auscultation. Respiratory effort normal.  Stable. Cardiovascular system: S1 & S2 heard, RRR. No JVD, murmurs, rubs, gallops or clicks. No pedal edema.  Gastrointestinal system: Abdomen is nondistended, soft and nontender. No organomegaly or masses felt. Normal bowel sounds heard.  Stable Central nervous system: Alert and oriented only to self.  No focal neurological deficits. Extremities:Moving all limbs symmetrically.  Skin: No rashes, lesions or ulcers Psychiatry: Judgement and insight impaired.     Data Reviewed: I have personally reviewed following labs and imaging studies  CBC: Recent Labs  Lab 09/24/17 1623 09/25/17 0819  WBC 8.6 8.3  NEUTROABS 5.3  --   HGB 12.8 12.0  HCT 38.7 36.6  MCV 97.7 97.9  PLT 152 017   Basic Metabolic Panel: Recent Labs  Lab 09/24/17 1623 09/25/17 0819  NA 139 138  K 4.3 3.9  CL 105 109  CO2 26 23  GLUCOSE 97 81  BUN 16 22*  CREATININE 0.71 0.72  CALCIUM 9.0 8.6*  MG 1.7  --    Liver Function Tests: Recent Labs  Lab 09/24/17 1623  AST 31  ALT 29  ALKPHOS 75  BILITOT 1.0  PROT 5.3*  ALBUMIN 3.2*   Cardiac Enzymes: Recent Labs  Lab 09/24/17 2027 09/25/17 0345 09/25/17 0819  TROPONINI 0.08* 0.88* 1.09*   HbA1C: Recent Labs    09/25/17 0345  HGBA1C 5.3   CBG: No results for input(s): GLUCAP in the last 168 hours.  Recent Results (from the past 240 hour(s))  Urine culture     Status: None   Collection Time: 09/24/17  5:54 PM  Result Value Ref Range Status   Specimen Description IN/OUT CATH  URINE  Final   Special Requests NONE  Final   Culture NO GROWTH  Final   Report Status 09/25/2017 FINAL  Final  Culture, blood (Routine X 2) w Reflex to ID Panel     Status: None (Preliminary result)   Collection Time: 09/24/17  6:41 PM  Result Value Ref Range Status   Specimen Description BLOOD BLOOD LEFT FOREARM  Final   Special Requests   Final    BOTTLES DRAWN AEROBIC AND ANAEROBIC Blood Culture adequate volume   Culture NO GROWTH 2 DAYS  Final   Report Status PENDING  Incomplete  Culture, blood (Routine X 2) w Reflex to ID Panel     Status: None (Preliminary result)   Collection Time: 09/24/17  6:55 PM  Result Value Ref Range Status   Specimen Description BLOOD LEFT HAND  Final   Special Requests   Final    BOTTLES DRAWN AEROBIC AND ANAEROBIC Blood Culture adequate volume   Culture NO GROWTH 2 DAYS  Final   Report Status PENDING  Incomplete  MRSA PCR Screening     Status: None   Collection Time: 09/25/17  1:07 AM  Result Value Ref Range Status   MRSA by PCR NEGATIVE NEGATIVE Final    Comment:        The GeneXpert MRSA Assay (FDA approved for NASAL specimens only), is one component of a comprehensive MRSA colonization surveillance program. It is not intended to diagnose MRSA infection nor to guide or monitor treatment for MRSA infections.          Radiology Studies: No results found.      Scheduled Meds: . aspirin  325 mg Oral Daily  . citalopram  10 mg Oral Daily  . diltiazem  60 mg Oral Daily  . donepezil  10 mg Oral QHS  . enoxaparin (LOVENOX) injection  40 mg Subcutaneous Q24H  . traZODone  25 mg Oral QHS   Continuous Infusions:    LOS: 3 days     Pascal Stiggers, MD, FACP, FHM. [facility] Pager [phone]  If 7PM-7AM, please contact night-coverage [email or web address] Password [facility] 09/27/2017, 2:38 PM

## 2017-09-27 NOTE — Clinical Social Work Note (Signed)
Patient's daughter not at bedside. CSW called and left her a voicemail. Will discuss SNF placement when she returns call.   Dayton Scrape, [place]

## 2017-09-27 NOTE — Clinical Social Work Placement (Signed)
   CLINICAL SOCIAL WORK PLACEMENT  NOTE  Date:  09/27/2017  Patient Details  Name: Beth Ramos MRN: [id number] Date of Birth: December 02, 1922  Clinical Social Work is seeking post-discharge placement for this patient at the [place] level of care (*CSW will initial, date and re-position this form in  chart as items are completed):  Yes   Patient/family provided with [place] Work Department's list of facilities offering this level of care within the geographic area requested by the patient (or if unable, by the patient's family).  Yes   Patient/family informed of their freedom to choose among providers that offer the needed level of care, that participate in Medicare, Medicaid or managed care program needed by the patient, have an available bed and are willing to accept the patient.  Yes   Patient/family informed of [facility]'s ownership interest in [facility] and [facility], as well as of the fact that they are under no obligation to receive care at these facilities.  PASRR submitted to EDS on 09/27/17     PASRR number received on       Existing PASRR number confirmed on 09/27/17     FL2 transmitted to all facilities in geographic area requested by pt/family on 09/27/17     FL2 transmitted to all facilities within larger geographic area on       Patient informed that his/her managed care company has contracts with or will negotiate with certain facilities, including the following:            Patient/family informed of bed offers received.  Patient chooses bed at       Physician recommends and patient chooses bed at      Patient to be transferred to   on  .  Patient to be transferred to facility by       Patient family notified on   of transfer.  Name of family member notified:        PHYSICIAN Please sign FL2     Additional Comment:    _______________________________________________ Candie Chroman, LCSW 09/27/2017,  11:52 AM

## 2017-09-27 NOTE — Clinical Social Work Note (Signed)
Clinical Social Work Assessment  Patient Details  Name: Beth Ramos MRN: [id number] Date of Birth: October 04, 1923  Date of referral:  09/27/17               Reason for consult:  Facility Placement, Discharge Planning                Permission sought to share information with:  Facility Sport and exercise psychologist, Family Supports Permission granted to share information::  Yes, Verbal Permission Granted  Name::     [name]  Agency::  SNF's  Relationship::  Daughter  Contact Information:  [phone]  Housing/Transportation Living arrangements for the past 2 months:  Single Family Home Source of Information:  Medical Team, Adult Children Patient Interpreter Needed:  None Criminal Activity/Legal Involvement Pertinent to Current Situation/Hospitalization:  No - Comment as needed Significant Relationships:  Adult Children Lives with:  Adult Children Do you feel safe going back to the place where you live?  No Need for family participation in patient care:  Yes (Comment)  Care giving concerns:  PT recommending SNF once medically stable for discharge.   Social Worker assessment / plan:  CSW received call back from patient's daughter. CSW introduced role and explained that PT recommendations would be discussed. She is agreeable to SNF placement but stated she cannot return home after rehab. Patient's daughter is hopeful for long-term care at a facility. She does not have Medicaid but based on daughter stating, "She doesn't have anything," she may qualify. CSW encouraged her to go to DSS to apply on her mother's behalf. CSW provided address to DSS. Patient's daughter stated that she is unable to private pay for a facility. She stated that someone had called her (sounded like it was from a home health agency) and made a referral to "the county" so they could assist with placement. CSW encouraged her to call them back as well for assistance with this process. Patient currently has a Corporate investment banker and  CSW informed her daughter that she cannot go to SNF until the telesitter has been discontinued for 24 hours. No further concerns. CSW encouraged patient's daughter to contact CSW as needed. CSW will continue to follow patient and her daughter for support and facilitate discharge to SNF once medically stable.  Employment status:  Retired Forensic scientist:  Other (Comment Required)([employer]) PT Recommendations:  [place] / Referral to community resources:  [place]  Patient/Family's Response to care:  Patient not oriented. Patient's daughter agreeable to SNF placement. Patient's daughter supportive and involved in patient's care. Patient's daughter appreciated social work intervention.  Patient/Family's Understanding of and Emotional Response to Diagnosis, Current Treatment, and Prognosis:  Patient not oriented. Patient's daughter has a good understanding of the reason for admission and is hopeful for long-term care at a facility. Patient's daughter appears happy with hospital care.  Emotional Assessment Appearance:  Appears stated age Attitude/Demeanor/Rapport:  Unable to Assess Affect (typically observed):  Unable to Assess Orientation:    Alcohol / Substance use:  Never Used Psych involvement (Current and /or in the community):  No (Comment)  Discharge Needs  Concerns to be addressed:  Care Coordination Readmission within the last 30 days:  No Current discharge risk:  Cognitively Impaired Barriers to Discharge:  Continued Medical Work up, Ship broker, Requiring sitter/restraints   Candie Chroman, LCSW 09/27/2017, 11:47 AM

## 2017-09-28 ENCOUNTER — Other Ambulatory Visit: Payer: Self-pay | Admitting: Licensed Clinical Social Worker

## 2017-09-28 ENCOUNTER — Ambulatory Visit: Payer: Self-pay

## 2017-09-28 NOTE — Progress Notes (Signed)
Physical Therapy Treatment Patient Details Name: Beth Ramos MRN: [id number] DOB: Oct 24, 1923 Today's Date: 09/28/2017    History of Present Illness 83 year old female, has been living with her daughter for the last 2 years, PMH of dementia diagnosed approximately 2 years ago and progressively worsening, hard of hearing, PAF not on anticoagulation, chronic diastolic CHF, anxiety, depression, chronic urinary frequency who presented to the ED with progressively worsening abnormal/irrational behavior and abdominal pain. As per discussion with daughter, patient quit/lost interest in activities that she liked to do almost a year ago such as crocheting, reading, knitting etc. In the last 3 months, her mental status has progressively worsened, eats well, sleeps most of the day, awake all night and keeps wandering around in the house, has even attempted to leave the house, keeps talking to herself. In the last few days, has been asking for her dog which died 3 years ago, a baby and accusing daughter that she killed her dog. She has threatened to kill her daughter's dog and wishes that her daughter was dead. In ED, hypotensive 74/44, A. fib with RVR but metabolic workup and sepsis workup negative. Reverted to SB/SR after amiodarone drip.    PT Comments    Session focused on increasing independence with transfers. Pt lethargic and in and out of sleep upon arrival. Pt progressing with transfers with min A for sit<>stand transfers x3. Bed independent with bed mobility. Pt ambulated 5 feet in hospital room with RW with min guarding. Able to tolerate static standing balance for 30 seconds without UE support. PT will cont to follow to address functional goals.    Follow Up Recommendations  SNF;Supervision/Assistance - 24 hour     Equipment Recommendations  None recommended by PT    Recommendations for Other Services       Precautions / Restrictions Precautions Precautions: Fall Restrictions Weight  Bearing Restrictions: No    Mobility  Bed Mobility Overal bed mobility: Independent Bed Mobility: Supine to Sit     Supine to sit: Independent        Transfers Overall transfer level: Needs assistance Equipment used: Rolling walker (2 wheeled);None Transfers: Sit to/from Stand Sit to Stand: Mod assist         General transfer comment: supine<>sit x3 to chair, bed, bedside commode. min-mod A with transfers. Pt able to perform without the use of RW.   Ambulation/Gait Ambulation/Gait assistance: Min assist Ambulation Distance (Feet): 5 Feet Assistive device: Rolling walker (2 wheeled) Gait Pattern/deviations: Step-through pattern;Trunk flexed         Stairs            Wheelchair Mobility    Modified Rankin (Stroke Patients Only)       Balance Overall balance assessment: Needs assistance;History of Falls Sitting-balance support: No upper extremity supported;Feet supported Sitting balance-Leahy Scale: Fair     Standing balance support: During functional activity;No upper extremity supported Standing balance-Leahy Scale: Fair Standing balance comment: relies on UE support for balance. able to wash herself after bathroom without UE support in static balance.                             Cognition Arousal/Alertness: Lethargic Behavior During Therapy: Flat affect Overall Cognitive Status: History of cognitive impairments - at baseline                                 General  Comments: Pt confused.  Not oriented to place time or situation.  Poor safety awareness.       Exercises      General Comments General comments (skin integrity, edema, etc.): Session limited by fatigue and lethargy. VSS throughout session.      Pertinent Vitals/Pain Pain Assessment: No/denies pain    Home Living                      Prior Function            PT Goals (current goals can now be found in the care plan section) Acute Rehab PT  Goals Patient Stated Goal: unable to state PT Goal Formulation: With patient Time For Goal Achievement: 10/10/17 Potential to Achieve Goals: Fair Progress towards PT goals: Progressing toward goals    Frequency    Min 3X/week      PT Plan Current plan remains appropriate    Co-evaluation              AM-PAC PT "6 Clicks" Daily Activity  Outcome Measure  Difficulty turning over in bed (including adjusting bedclothes, sheets and blankets)?: None Difficulty moving from lying on back to sitting on the side of the bed? : None Difficulty sitting down on and standing up from a chair with arms (e.g., wheelchair, bedside commode, etc,.)?: A Little Help needed moving to and from a bed to chair (including a wheelchair)?: A Lot Help needed walking in hospital room?: A Lot Help needed climbing 3-5 steps with a railing? : Total 6 Click Score: 16    End of Session   Activity Tolerance: Patient limited by fatigue;Patient limited by lethargy Patient left: in bed;with bed alarm set;with call bell/phone within reach Nurse Communication: Mobility status PT Visit Diagnosis: Unsteadiness on feet (R26.81);Muscle weakness (generalized) (M62.81)     Time: 1340-1410 PT Time Calculation (min) (ACUTE ONLY): 30 min  Charges:  $Therapeutic Activity: 8-22 mins                    G Codes:      Reinaldo Berber, PT, DPT Acute Rehab Services Pager: [phone]     Reinaldo Berber 09/28/2017, 2:37 PM

## 2017-09-28 NOTE — Care Management Important Message (Signed)
Important Message  Patient Details  Name: Beth Ramos MRN: [id number] Date of Birth: February 18, 1923   Medicare Important Message Given:  Yes    Pranika Finks Montine Circle 09/28/2017, 12:35 PM

## 2017-09-28 NOTE — Progress Notes (Signed)
Was notified by MD [facility] that we should try to stop the telesitter and monitor how the patient does without it to expedite patient being able to go to SNF. Will take off Eve[place]ts sitter now.

## 2017-09-28 NOTE — Progress Notes (Signed)
CSW contacted patient's daughter to provide bed offers and discuss SNF placement options. CSW provided the two bed offers that the patient has received in [place], [place] and [place]. Patient's daughter indicated that they were not very convenient to her location, she would prefer if [facility] or [facility] would be able to take the patient. [facility] has already denied, but [facility] has not responded. CSW made a call to [facility] to check on potential for bed offer; left message.  CSW also indicated to patient's daughter that [employer] authorization would need to be obtained for SNF placement, and that it may or may not come through as being approved. Patient's daughter discussed that she is going to DSS tomorrow to apply for Medicaid, but the patient nor the family have the resources to be able to pay. Patient's daughter acknowledged understanding, but indicated that somebody is going to have to pay for the patient to go to SNF because the daughter is not able to provide care for the patient.  CSW to continue to follow. Patient will need to have telesitter DC'd for 24 hours prior to admission to SNF.  Laveda Abbe, [place] Clinical Social Worker [phone]

## 2017-09-28 NOTE — Progress Notes (Signed)
PROGRESS NOTE   Beth Ramos  GEX:[id number]    DOB: 1923/04/12    DOA: 09/24/2017  PCP: Lajean Manes, MD   I have briefly reviewed patients previous medical records in [facility].  Brief Narrative:  83 year old female, has been living with her daughter for the last 2 years, PMH of dementia diagnosed approximately 2 years ago and progressively worsening, hard of hearing, PAF not on anticoagulation, chronic diastolic CHF, anxiety, depression, chronic urinary frequency who presented to the ED with progressively worsening abnormal/irrational behavior and abdominal pain. As per discussion with daughter, patient quit/lost interest in activities that she liked to do almost a year ago such as crocheting, reading, knitting etc. In the last 3 months, her mental status has progressively worsened, eats well, sleeps most of the day, awake all night and keeps wandering around in the house, has even attempted to leave the house, keeps talking to herself. In the last few days, has been asking for her dog which died 3 years ago, a baby and accusing daughter that she killed her dog. She has threatened to kill her daughter's dog and wishes that her daughter was dead. In ED, hypotensive 74/44, A. fib with RVR but metabolic workup and sepsis workup negative. Reverted to SB/SR after amiodarone drip. Cardiology consulted. Social worker consulted for nursing home placement (daughter cannot take care of her).  Patient medically stable for discharge to SNF pending PT, OT evaluation and bed availability.   Assessment & Plan:   Principal Problem:   Dementia with behavioral disturbance Active Problems:   Paroxysmal a-fib (HCC)   Dementia   Dehydration   Atrial fibrillation with RVR (HCC)   Hypotension   Acute metabolic encephalopathy   Chronic diastolic CHF (congestive heart failure) (HCC)   Palliative care by specialist   A. fib with RVR - Presented with ventricular rate in the 140s. She was also  hypotensive in the ED. Unable to use Cardizem at that time. Her case was discussed with cardiologist on-call and she was started on IV amiodarone infusion. - She reverted to sinus bradycardia in the 50s-sinus rhythm, overnight on the night of admission. - Restarted home dose of Cardizem CD 60 mg daily (12 hour preparation).  - CHA2DS2-VASc Score is 4. Not a good anticoagulation candidate due to advanced age, progressively worsening dementia, fall risk and noted GI bleed history. - TSH normal. Free T4 mildly elevated probably of no significance. May repeat as outpatient.  Patient not cooperative to perform 2D echo. - EKG 11/2 personally reviewed: Sinus rhythm at 63 bpm, normal axis, T-wave inversion in leads V1-4, no other acute changes and QTC 405 ms. -Cardiology saw and signed off. -Remains in sinus rhythm.  No further workup.  Transferred to medical bed.  Progressive dementia with behavioral abnormalities  - Suspect that her irrational/abnormal behavior and aggressiveness is due to progressively worsening dementia. No obvious metabolic or infectious cause noted. CT head without acute abnormalities. Low index of suspicion for acute stroke. No current pain issues. Hard of hearing may be contributing. Disturbed sleep rhythm may also be contributing. - As per daughter, recently started on bedtime trazodone but has only taken one dose. - Attempt to normalize sleep rhythm, minimize opioids and benzodiazepines, treat pain with acetaminophen, hearing aids and consider when necessary Haldol or Seroquel. Continue Aricept. - Daughter indicates that she will not be able to manage patient's care at home due to her worsening mental status changes and daughter works. Clinical social worker consulted. -Psychiatric consultation  appreciated.  Started Celexa 10 mg daily.  Ongoing issues with agitation mostly at nights.  Started Seroquel 12.5 mg at bedtime. - Much improved, less agitation and impulsiveness. -  Discussed with RN and discontinued safety sitter in preparation for DC to SNF when bed available but advised RN to monitor closely.  Elevated troponin - Likely demand ischemia due to hypotension and A. fib with RVR. No chest pain reported. May not be a candidate for aggressive intervention and hence ischemic evaluation.  Not cooperative to perform 2D echo.  Hypotension - May be due to dehydration and A. fib with RVR. Improved but blood pressures remain soft. Monitor closely.  - Cortisol 12.5. -Resolved.  Discontinue IV fluids.  Chronic diastolic CHF 2-D echo 3/87/56: LVEF 65-70 percent and grade 1 diastolic dysfunction.  Compensated.  Anxiety & depression Does not seem to be on medications at home for this. TSH normal.  Reported abdominal pain -Resolved. Urine microscopy negative. CT abdomen without acute findings.  Large hiatal hernia Seen on imaging  Aortic atherosclerosis Seen on chest x-ray and CT abdomen. Continue aspirin.  Urinary frequency - ? Overactive bladder. UA negative.  Urine culture negative.  Blood cultures x2: Negative to date.  Suspected fall On 09/26/17 morning, RN reported that patient was seen sitting on the mat next to her low bed and suspicious for fall because bed rails were up.  No injuries noted.  Hemodynamically and physical exam stable.  As discussed with RN, fall precautions and Air cabin crew.  PT and OT recommend SNF.   DVT prophylaxis: Lovenox  Code Status: DO NOT RESUSCITATE  Family Communication: Discussed in detail with patient's daughter at bedside. Updated care and answered questions. Disposition: SNF when bed available. Discontinued safety sitter 09/28/17.  Consultants:  Cardiology  Psychiatry  Procedures:  None   Antimicrobials:  None    Subjective: Seen this morning. Daughter at bedside. Reported some dizziness when her daughter asked her if she was dizzy. She was not able to elaborate. No chest pain, dyspnea or palpitations. As  per RN, uneventful night prior without issues with agitation.  ROS: Rest of history as discussed with daughter, in brief narrative above.   Objective:  Vitals:   09/27/17 1337 09/27/17 2127 09/28/17 0353 09/28/17 0411  BP: (!) 115/47 (!) 133/56 (!) 130/55 132/68  Pulse: 67 73 64 61  Resp: 15 16 14 15   Temp: 98 F (36.7 C) 98.6 F (37 C) 98.7 F (37.1 C) 98.1 F (36.7 C)  TempSrc: Oral Oral Axillary Axillary  SpO2: 94% 94% 92% 92%  Weight:      Height:        Examination:  General exam: Pleasant elderly female, moderately built and thinly nourished, lying comfortably supine in bed.  Oral mucosa moist.  Does not appear in any distress.  Answers questions appropriately and cooperative to exam. Respiratory system: Clear to auscultation. Respiratory effort normal.  Stable. Cardiovascular system: S1 & S2 heard, RRR. No JVD, murmurs, rubs, gallops or clicks. No pedal edema.  Gastrointestinal system: Abdomen is nondistended, soft and nontender. No organomegaly or masses felt. Normal bowel sounds heard.  Stable Central nervous system: Alert and oriented only to self and place.  No focal neurological deficits. Extremities:Moving all limbs symmetrically.  Skin: No rashes, lesions or ulcers Psychiatry: Judgement and insight impaired.     Data Reviewed: I have personally reviewed following labs and imaging studies  CBC: Recent Labs  Lab 09/24/17 1623 09/25/17 0819  WBC 8.6 8.3  NEUTROABS 5.3  --  HGB 12.8 12.0  HCT 38.7 36.6  MCV 97.7 97.9  PLT 152 400   Basic Metabolic Panel: Recent Labs  Lab 09/24/17 1623 09/25/17 0819  NA 139 138  K 4.3 3.9  CL 105 109  CO2 26 23  GLUCOSE 97 81  BUN 16 22*  CREATININE 0.71 0.72  CALCIUM 9.0 8.6*  MG 1.7  --    Liver Function Tests: Recent Labs  Lab 09/24/17 1623  AST 31  ALT 29  ALKPHOS 75  BILITOT 1.0  PROT 5.3*  ALBUMIN 3.2*   Cardiac Enzymes: Recent Labs  Lab 09/24/17 2027 09/25/17 0345 09/25/17 0819    TROPONINI 0.08* 0.88* 1.09*   HbA1C: No results for input(s): HGBA1C in the last 72 hours. CBG: No results for input(s): GLUCAP in the last 168 hours.  Recent Results (from the past 240 hour(s))  Urine culture     Status: None   Collection Time: 09/24/17  5:54 PM  Result Value Ref Range Status   Specimen Description IN/OUT CATH URINE  Final   Special Requests NONE  Final   Culture NO GROWTH  Final   Report Status 09/25/2017 FINAL  Final  Culture, blood (Routine X 2) w Reflex to ID Panel     Status: None (Preliminary result)   Collection Time: 09/24/17  6:41 PM  Result Value Ref Range Status   Specimen Description BLOOD BLOOD LEFT FOREARM  Final   Special Requests   Final    BOTTLES DRAWN AEROBIC AND ANAEROBIC Blood Culture adequate volume   Culture NO GROWTH 4 DAYS  Final   Report Status PENDING  Incomplete  Culture, blood (Routine X 2) w Reflex to ID Panel     Status: None (Preliminary result)   Collection Time: 09/24/17  6:55 PM  Result Value Ref Range Status   Specimen Description BLOOD LEFT HAND  Final   Special Requests   Final    BOTTLES DRAWN AEROBIC AND ANAEROBIC Blood Culture adequate volume   Culture NO GROWTH 4 DAYS  Final   Report Status PENDING  Incomplete  MRSA PCR Screening     Status: None   Collection Time: 09/25/17  1:07 AM  Result Value Ref Range Status   MRSA by PCR NEGATIVE NEGATIVE Final    Comment:        The GeneXpert MRSA Assay (FDA approved for NASAL specimens only), is one component of a comprehensive MRSA colonization surveillance program. It is not intended to diagnose MRSA infection nor to guide or monitor treatment for MRSA infections.          Radiology Studies: No results found.      Scheduled Meds: . aspirin  325 mg Oral Daily  . citalopram  10 mg Oral Daily  . diltiazem  60 mg Oral Daily  . donepezil  10 mg Oral QHS  . enoxaparin (LOVENOX) injection  40 mg Subcutaneous Q24H  . QUEtiapine  12.5 mg Oral QHS  .  traZODone  25 mg Oral QHS   Continuous Infusions:    LOS: 4 days     Nedda Gains, MD, FACP, FHM. [facility] Pager [phone]  If 7PM-7AM, please contact night-coverage [email or web address] Password [email or web address] 09/28/2017, 4:01 PM

## 2017-09-28 NOTE — Patient Outreach (Signed)
[facility]) Care Management  09/28/2017  PEARL BENTS 24-Sep-1923 [id number]  Assessment- CSW completed chart review and is aware that hospital is planning for patient to be discharged to a SNF. CSW completed call to patient's daughter to discuss plan of care and encourage her to apply for LTC Medicaid. CSW was unable to reach daughter but left a HIPPA complaint voice message encouraging a return call once available. CSW also left the DSS Placement Social Worker's number on the voice message and encouraged her to contact her as well. CSW received incoming return call from daughter later in the day. CSW advised daughter that she will need to go to DSS and apply for LTC Medicaid. CSW provided education on that process as well as listed documents that she will need in order to apply for services. Daughter appreciative of phone call. FL2 has been completed in the hospital and daughter will need access to this to provide to DSS if at all possible. CSW will sent in basket message to [facility].   Plan-CSW will continue to provide social work assistance to family.  [name], BSW, MSW, [email or web address] Phone: [phone] Fax: [phone]

## 2017-09-29 ENCOUNTER — Other Ambulatory Visit: Payer: Self-pay | Admitting: Licensed Clinical Social Worker

## 2017-09-29 ENCOUNTER — Inpatient Hospital Stay (HOSPITAL_COMMUNITY): Payer: PPO

## 2017-09-29 DIAGNOSIS — I340 Nonrheumatic mitral (valve) insufficiency: Secondary | ICD-10-CM

## 2017-09-29 LAB — CBC
HEMATOCRIT: 35.6 % — AB (ref 36.0–46.0)
Hemoglobin: 11.8 g/dL — ABNORMAL LOW (ref 12.0–15.0)
MCH: 32.6 pg (ref 26.0–34.0)
MCHC: 33.1 g/dL (ref 30.0–36.0)
MCV: 98.3 fL (ref 78.0–100.0)
Platelets: 174 10*3/uL (ref 150–400)
RBC: 3.62 MIL/uL — ABNORMAL LOW (ref 3.87–5.11)
RDW: 15.2 % (ref 11.5–15.5)
WBC: 8.9 10*3/uL (ref 4.0–10.5)

## 2017-09-29 LAB — BASIC METABOLIC PANEL
Anion gap: 4 — ABNORMAL LOW (ref 5–15)
BUN: 14 mg/dL (ref 6–20)
CALCIUM: 8.7 mg/dL — AB (ref 8.9–10.3)
CO2: 28 mmol/L (ref 22–32)
Chloride: 108 mmol/L (ref 101–111)
Creatinine, Ser: 0.66 mg/dL (ref 0.44–1.00)
GFR calc Af Amer: >60 mL/min (ref >60)
GLUCOSE: 97 mg/dL (ref 65–99)
POTASSIUM: 3.6 mmol/L (ref 3.5–5.1)
Sodium: 140 mmol/L (ref 135–145)

## 2017-09-29 LAB — CULTURE, BLOOD (ROUTINE X 2)
Culture: NO GROWTH
Culture: NO GROWTH
SPECIAL REQUESTS: ADEQUATE
Special Requests: ADEQUATE

## 2017-09-29 LAB — ECHOCARDIOGRAM COMPLETE
HEIGHTINCHES: 62 in
Weight: 1848.34 oz

## 2017-09-29 MED ORDER — ENSURE ENLIVE PO LIQD
237.0000 mL | Freq: Two times a day (BID) | ORAL | Status: DC
Start: 2017-09-29 — End: 2017-09-30
  Administered 2017-09-29 – 2017-09-30 (×3): 237 mL via ORAL

## 2017-09-29 MED ORDER — SODIUM CHLORIDE 0.9 % IV SOLN
INTRAVENOUS | Status: DC
  Administered 2017-09-29 – 2017-09-30 (×2): via INTRAVENOUS

## 2017-09-29 NOTE — Progress Notes (Addendum)
CSW following to facilitate discharge planning. CSW completed coordination activities throughout the morning:  9:30 AM:  CSW spoke with Brooke through [facility], who had been in communication with patient's daughter this morning. Brooke discussed the case and anything that [facility] can do to help. Brooke requested that [place] fax the [facility] over to the DSS office where the patient's daughter was at completing a Medicaid application. CSW faxed FL2 to DSS for the patient's Medicaid application.  10:15 AM:  CSW spoke with patient's daughter, Beth Ramos, to discuss concerns with SNF placement and feeling overwhelmed with the process. CSW discussed concerns and provided feedback. Beth Ramos will come to the hospital and reach out when here to discuss. CSW checked in with [facility] to review case and determine if patient could admit; left message.  11:45 AM:  CSW received call from [facility] that patient has been denied; requesting Peer to Peer. CSW notified MD; sent information for Peer to Peer.  12:15 PM:  CSW met with patient's daughter at bedside to discuss updates. CSW provided update that [employer] has denied Josem Kaufmann, and MD will perform peer to peer review. Patient's daughter indicated understanding and requested looking at other ALF options, as well, including [facility] and [place]. CSW to continue to follow.  Laveda Abbe, LCSW Clinical Social Worker [phone]   2:30 PM:  CSW received notification from [facility] that facility cannot accept patient at this time. CSW to notify family. CSW also received official denial from [facility]; requested denial letters for patient's daughter and medical records. CSW to continue to follow.  Laveda Abbe, [place] Clinical Social Worker [phone]

## 2017-09-29 NOTE — Patient Outreach (Signed)
[facility]) Care Management  09/29/2017  Beth Ramos 12/18/1922 [id number]  Assessment- CSW received return call from inpatient social worker Rexene Alberts and discussed case. She is agreeable to fax FL2 to DSS caseworker at [phone]. CSW was informed that there is a chance insurance may not approve for SNF placement which means we would need to rely on long term placement at a facility instead. [facility] CSW feels that [place] would be a good fit for patient but daughter states that facility is too far away from her residence even though is it exactly 10.1 miles away from her residence.   Plan-CSW will continue to follow case and provide assistance where needed.  [name], BSW, MSW, [email or web address] Phone: [phone] Fax: [phone]

## 2017-09-29 NOTE — Progress Notes (Signed)
  Echocardiogram 2D Echocardiogram has been performed.  Beth Ramos 09/29/2017, 12:17 PM

## 2017-09-29 NOTE — Patient Outreach (Signed)
[facility]) Care Management  09/29/2017  Beth Ramos Aug 25, 1923 [id number]  Assessment- CSW received incoming call from patient's daughter who was tearful over the phone. She reports that she is worried that patient will discharge to a SNF that is too far for her to drive to. She is asking for any help to get patient to at SNF that is closer to her residence. CSW informed her that [facility] would be closer than [facility] but she stated that [facility] is still too far for her to drive. Daughter is asking that I communicate with hospital staff to see if patient had been accepted at [facility]. Daughter is currently at [place] for LTC Medicaid. CSW spoke to [facility] caseworker and asked if FL2 from hospital would suffice for placed and she stated that it must be the complete FL2 form that is approved by the state. CSW has already sent a in basket message to [place] Liaison in regards to this. CSW completed call to inpatient [place] and left a detailed voice message requesting a return call once available.  Plan-CSW will follow up within 48 hours.  [name], BSW, MSW, [email or web address] Phone: [phone] Fax: [phone]

## 2017-09-29 NOTE — Progress Notes (Signed)
PROGRESS NOTE    Beth Ramos  ZDG:[id number] DOB: [birth year] DOA: 09/24/2017 PCP: Lajean Manes, MD    Brief Narrative: 83 year old female, has been living with her daughter for the last 2 years, PMH of dementia diagnosed approximately 2 years ago and progressively worsening, hard of hearing, PAF not on anticoagulation, chronic diastolic CHF, anxiety, depression, chronic urinary frequency who presented to the ED with progressively worsening abnormal/irrational behavior and abdominal pain. As per discussion with daughter, patient quit/lost interest in activities that she liked to do almost a year ago such as crocheting, reading, knitting etc. In the last 3 months, her mental status has progressively worsened, eats well, sleeps most of the day, awake all night and keeps wandering around in the house, has even attempted to leave the house, keeps talking to herself. In the last few days, has been asking for her dog which died 3 years ago, a baby and accusing daughter that she killed her dog. She has threatened to kill her daughter's dog and wishes that her daughter was dead. In ED, hypotensive 74/44, A. fib with RVR but metabolic workup and sepsis workup negative. Reverted to SB/SR after amiodarone drip. Cardiology consulted. Social worker consulted for nursing home placement (daughter cannot take care of her).  Patient medically stable for discharge to SNF pending PT, OT evaluation and bed availability.    Assessment & Plan:   Principal Problem:   Dementia with behavioral disturbance Active Problems:   Paroxysmal a-fib (HCC)   Dementia   Dehydration   Atrial fibrillation with RVR (HCC)   Hypotension   Acute metabolic encephalopathy   Chronic diastolic CHF (congestive heart failure) (HCC)   Palliative care by specialist    A. fib with RVR - Presented with ventricular rate in the 140s. She was also hypotensive in the ED. Unable to use Cardizem at that time. Her case was discussed with  cardiologist on-call and she was started on IV amiodarone infusion. - She reverted to sinus bradycardia in the 50s-sinus rhythm, overnight on the night of admission. - Restarted home dose of Cardizem CD 60 mg daily (12 hour preparation).  - CHA2DS2-VASc Scoreis 4. Not a good anticoagulation candidate due to advanced age, progressively worsening dementia, fall risk and noted GI bleed history. - TSH normal. Free T4 mildly elevated probably of no significance. May repeat as outpatient.  - EKG 11/2 personally reviewed: Sinus rhythm at 63 bpm, normal axis, T-wave inversion in leads V1-4, no other acute changes and QTC 405 ms. -Cardiology saw and signed off. -Remains in sinus rhythm.  No further workup.   -ECHO pending.  -elevation of troponin. ECHO pending.   Dizziness;  Check Orthostatic vitals.  Start IV fluids.   Progressive dementia with behavioral abnormalities  - Suspect that her irrational/abnormal behavior and aggressiveness is due to progressively worsening dementia. No obvious metabolic or infectious cause noted. CT head without acute abnormalities. Low index of suspicion for acute stroke. No current pain issues. Hard of hearing may be contributing. Disturbed sleep rhythm may also be contributing. - Continue Aricept. -Psychiatric consultation appreciated.  Started Celexa 10 mg daily.  -Started Seroquel 12.5 mg at bedtime. - Much improved, less agitation and impulsiveness. -PT to evaluate again 11-07  Elevated troponin - Likely demand ischemia due to hypotension and A. fib with RVR. No chest pain reported. May not be a candidate for aggressive intervention and hence ischemic evaluation.  ECHO pending.   Hypotension - May be due to dehydration and A. fib with RVR.  Improved but blood pressures remain soft. Monitor closely.  - Cortisol 12.5.   Chronic diastolic CHF 2-D echo 1/61/09: LVEF 65-70 percent and grade 1 diastolic dysfunction.  Compensated.  Anxiety &  depression Does not seem to be on medications at home for this. TSH normal.  Reported abdominal pain -Resolved. Urine microscopy negative. CT abdomen without acute findings.  Large hiatal hernia Seen on imaging  Aortic atherosclerosis Seen on chest x-ray and CT abdomen. Continue aspirin.  Urinary frequency - ? Overactive bladder. UA negative.  Urine culture negative.  Blood cultures x2: Negative to date.  Suspected fall On 09/26/17 morning, RN reported that patient was seen sitting on the mat next to her low bed and suspicious for fall because bed rails were up.  No injuries noted.  Hemodynamically and physical exam stable.  As discussed with RN, fall precautions and Air cabin crew.  PT and OT recommend SNF.      DVT prophylaxis: Lovenox Code Status: DNR Family Communication: Daughter at bedside.  Disposition Plan: to be determine.    Consultants:  Cardiology  Psychiatry      Procedures:   ECHO pending.   Antimicrobials: none  Subjective: She report dizziness on standing,  Denies chest pain.   Objective: Vitals:   09/28/17 0411 09/28/17 1421 09/28/17 2238 09/29/17 0418  BP: 132/68 128/64 124/61 133/62  Pulse: 61 72 77 67  Resp: 15 16 16 15   Temp: 98.1 F (36.7 C) 98.5 F (36.9 C) 98.9 F (37.2 C) 98.6 F (37 C)  TempSrc: Axillary Oral Oral Oral  SpO2: 92% 94% 94% 93%  Weight:      Height:        Intake/Output Summary (Last 24 hours) at 09/29/2017 1219 Last data filed at 09/29/2017 0900 Gross per 24 hour  Intake 260 ml  Output 200 ml  Net 60 ml   Filed Weights   09/24/17 1515 09/25/17 0332  Weight: 49 kg (108 lb) 52.4 kg (115 lb 8.3 oz)    Examination:  General exam: Appears calm and comfortable  Respiratory system: Clear to auscultation. Respiratory effort normal. Cardiovascular system: S1 & S2 heard, RRR. No JVD, murmurs, rubs, gallops or clicks. No pedal edema. Gastrointestinal system: Abdomen is nondistended, soft and nontender. No  organomegaly or masses felt. Normal bowel sounds heard. Central nervous system: Alert and oriented. No focal neurological deficits. Extremities: Symmetric 5 x 5 power. Skin: No rashes, lesions or ulcers   Data Reviewed: I have personally reviewed following labs and imaging studies  CBC: Recent Labs  Lab 09/24/17 1623 09/25/17 0819  WBC 8.6 8.3  NEUTROABS 5.3  --   HGB 12.8 12.0  HCT 38.7 36.6  MCV 97.7 97.9  PLT 152 604   Basic Metabolic Panel: Recent Labs  Lab 09/24/17 1623 09/25/17 0819  NA 139 138  K 4.3 3.9  CL 105 109  CO2 26 23  GLUCOSE 97 81  BUN 16 22*  CREATININE 0.71 0.72  CALCIUM 9.0 8.6*  MG 1.7  --    GFR: Estimated Creatinine Clearance: 35.5 mL/min (by C-G formula based on SCr of 0.72 mg/dL). Liver Function Tests: Recent Labs  Lab 09/24/17 1623  AST 31  ALT 29  ALKPHOS 75  BILITOT 1.0  PROT 5.3*  ALBUMIN 3.2*   No results for input(s): LIPASE, AMYLASE in the last 168 hours. No results for input(s): AMMONIA in the last 168 hours. Coagulation Profile: No results for input(s): INR, PROTIME in the last 168 hours. Cardiac Enzymes: Recent  Labs  Lab 09/24/17 2027 09/25/17 0345 09/25/17 0819  TROPONINI 0.08* 0.88* 1.09*   BNP (last 3 results) No results for input(s): PROBNP in the last 8760 hours. HbA1C: No results for input(s): HGBA1C in the last 72 hours. CBG: No results for input(s): GLUCAP in the last 168 hours. Lipid Profile: No results for input(s): CHOL, HDL, LDLCALC, TRIG, CHOLHDL, LDLDIRECT in the last 72 hours. Thyroid Function Tests: No results for input(s): TSH, T4TOTAL, FREET4, T3FREE, THYROIDAB in the last 72 hours. Anemia Panel: No results for input(s): VITAMINB12, FOLATE, FERRITIN, TIBC, IRON, RETICCTPCT in the last 72 hours. Sepsis Labs: Recent Labs  Lab 09/24/17 1657  LATICACIDVEN 1.48    Recent Results (from the past 240 hour(s))  Urine culture     Status: None   Collection Time: 09/24/17  5:54 PM  Result Value  Ref Range Status   Specimen Description IN/OUT CATH URINE  Final   Special Requests NONE  Final   Culture NO GROWTH  Final   Report Status 09/25/2017 FINAL  Final  Culture, blood (Routine X 2) w Reflex to ID Panel     Status: None (Preliminary result)   Collection Time: 09/24/17  6:41 PM  Result Value Ref Range Status   Specimen Description BLOOD BLOOD LEFT FOREARM  Final   Special Requests   Final    BOTTLES DRAWN AEROBIC AND ANAEROBIC Blood Culture adequate volume   Culture NO GROWTH 4 DAYS  Final   Report Status PENDING  Incomplete  Culture, blood (Routine X 2) w Reflex to ID Panel     Status: None (Preliminary result)   Collection Time: 09/24/17  6:55 PM  Result Value Ref Range Status   Specimen Description BLOOD LEFT HAND  Final   Special Requests   Final    BOTTLES DRAWN AEROBIC AND ANAEROBIC Blood Culture adequate volume   Culture NO GROWTH 4 DAYS  Final   Report Status PENDING  Incomplete  MRSA PCR Screening     Status: None   Collection Time: 09/25/17  1:07 AM  Result Value Ref Range Status   MRSA by PCR NEGATIVE NEGATIVE Final    Comment:        The GeneXpert MRSA Assay (FDA approved for NASAL specimens only), is one component of a comprehensive MRSA colonization surveillance program. It is not intended to diagnose MRSA infection nor to guide or monitor treatment for MRSA infections.          Radiology Studies: No results found.      Scheduled Meds: . aspirin  325 mg Oral Daily  . citalopram  10 mg Oral Daily  . diltiazem  60 mg Oral Daily  . donepezil  10 mg Oral QHS  . enoxaparin (LOVENOX) injection  40 mg Subcutaneous Q24H  . QUEtiapine  12.5 mg Oral QHS   Continuous Infusions:   LOS: 5 days    Time spent: 35 minutes.     Elmarie Shiley, MD [facility] Pager [phone]  If 7PM-7AM, please contact night-coverage [email or web address] Password [email or web address] 09/29/2017, 12:19 PM

## 2017-09-29 NOTE — Consult Note (Signed)
   [facility] CM Inpatient Consult   09/29/2017  Beth Ramos March 20, 1923 [id number]  Follow up:  Met with patient's daughter Beth Ramos, daughter's friend and Dr. Tyrell Antonio awaiting the Inpatient social, [name].  Daughter states, "I have waited over an hour and I have to get back to work and also work on some more paper work for my mom."  She begins crying, "I am all she has, I'm it and I know I can't take care of her like this."   CSW arrives and indicates to MD that a peer to peer review is needed with [employer].   Spoke with daughter, who is very anxious about her mom's disposition.  She states she is concerned that her mom is always dizzy and has had several falls with fractures and a need for ongoing therapy.  Daughter talking with social worker about her own personal issues with driving and all states, "I am 83 years old myself and I have a job which is across the street from me and I have to work."  Daughter left with her friend.  Will continue to follow up.   For questions, please contact:  Natividad Brood, RN BSN [facility] Liaison  [phone] business mobile phone Toll free office [phone]

## 2017-09-30 MED ORDER — CITALOPRAM HYDROBROMIDE 10 MG PO TABS: 10.0000 mg | ORAL_TABLET | Freq: Every day | ORAL | 0 refills | Status: AC

## 2017-09-30 MED ORDER — ENSURE ENLIVE PO LIQD: 237.0000 mL | Freq: Two times a day (BID) | ORAL | 12 refills | Status: AC

## 2017-09-30 MED ORDER — QUETIAPINE FUMARATE 25 MG PO TABS: 12.5000 mg | ORAL_TABLET | Freq: Every day | ORAL | 0 refills | Status: AC

## 2017-09-30 MED ORDER — ASPIRIN 325 MG PO TABS: 325.0000 mg | ORAL_TABLET | Freq: Every day | ORAL | 0 refills | Status: AC

## 2017-09-30 NOTE — Social Work (Signed)
Clinical Social Worker facilitated patient discharge including contacting patient family and facility to confirm patient discharge plans.  Clinical information faxed to facility and family agreeable with plan.    CSW arranged ambulance transport via [facility] to [facility].    RN to call [phone] to give report prior to discharge.  Clinical Social Worker will sign off for now as social work intervention is no longer needed. Please consult us again if new need arises.  Jakelin Taussig, LCSW Clinical Social Worker [phone]      

## 2017-09-30 NOTE — Clinical Social Work Placement (Signed)
   CLINICAL SOCIAL WORK PLACEMENT  NOTE  Date:  09/30/2017  Patient Details  Name: Beth Ramos MRN: [id number] Date of Birth: [birth year]  Clinical Social Work is seeking post-discharge placement for this patient at the [place] level of care (*CSW will initial, date and re-position this form in  chart as items are completed):  Yes   Patient/family provided with [place] Work Department's list of facilities offering this level of care within the geographic area requested by the patient (or if unable, by the patient's family).  Yes   Patient/family informed of their freedom to choose among providers that offer the needed level of care, that participate in Medicare, Medicaid or managed care program needed by the patient, have an available bed and are willing to accept the patient.  Yes   Patient/family informed of [facility] ownership interest in [facility] and [facility], as well as of the fact that they are under no obligation to receive care at these facilities.  PASRR submitted to EDS on 09/27/17     PASRR number received on       Existing PASRR number confirmed on 09/27/17     FL2 transmitted to all facilities in geographic area requested by pt/family on 09/27/17     FL2 transmitted to all facilities within larger geographic area on       Patient informed that his/her managed care company has contracts with or will negotiate with certain facilities, including the following:        Yes   Patient/family informed of bed offers received.  Patient chooses bed at [facility]     Physician recommends and patient chooses bed at      Patient to be transferred to [facility] on 09/30/17.  Patient to be transferred to facility by [facility]     Patient family notified on 09/30/17 of transfer.  Name of family member notified:  daughter notified     PHYSICIAN Please sign FL2, Please prepare priority discharge summary, including  medications, Please prepare prescriptions, Please sign DNR     Additional Comment:    _______________________________________________ Normajean Baxter, LCSW 09/30/2017, 4:04 PM

## 2017-09-30 NOTE — NC FL2 (Signed)
[place] LEVEL OF CARE SCREENING TOOL     IDENTIFICATION  Patient Name: Beth Ramos Birthdate: [birth year] Sex: female Admission Date (Current Location): 09/24/2017  [facility] and [place] Number:  Herbalist and Address:  [facility] [place], [place] [address], [place], [place] [address]      Provider Number: [id number]  Attending Physician Name and Address:  Elmarie Shiley, MD  Relative Name and Phone Number:       Current Level of Care: Hospital Recommended Level of Care: Assisted Living Facility(with PT/OT) Prior Approval Number:    Date Approved/Denied:   PASRR Number: [id number]  Discharge Plan: Other (Comment)(ALF with PT/OT)    Current Diagnoses: Patient Active Problem List   Diagnosis Date Noted  . Dementia with behavioral disturbance 09/25/2017  . Palliative care by specialist   . Atrial fibrillation with RVR ([place]) 09/24/2017  . Hypotension 09/24/2017  . Acute metabolic encephalopathy 05/23/1600  . Chronic diastolic CHF (congestive heart failure) ([place]) 09/24/2017  . Vasovagal syncope 12/08/2016  . Dementia 12/08/2016  . Dehydration 12/08/2016  . Colitis 12/08/2016  . Thoracic compression fracture ([place]) 12/08/2016  . Pelvic fracture ([place]) 07/11/2012  . H/O: depression 07/11/2012  . Paroxysmal a-fib ([place]) 11/06/2011  . Hypokalemia 11/06/2011  . Leukocytosis 11/03/2011  . Tachycardia 11/03/2011  . Weakness generalized 11/03/2011    Orientation RESPIRATION BLADDER Height & Weight     Self  Normal Continent Weight: 115 lb 8.3 oz (52.4 kg) Height:  5\' 2"  (157.5 cm)  BEHAVIORAL SYMPTOMS/MOOD NEUROLOGICAL BOWEL NUTRITION STATUS  Verbally abusive, Physically abusive (Dementia with behavioral disturbance) Incontinent Diet(Regular)  AMBULATORY STATUS COMMUNICATION OF NEEDS Skin   Limited Assist Verbally Bruising                       Personal Care Assistance Level of Assistance  Bathing, Feeding, Dressing  Bathing Assistance: Limited assistance Feeding assistance: Limited assistance Dressing Assistance: Limited assistance     Functional Limitations Info  Sight, Hearing, Speech Sight Info: Adequate Hearing Info: Adequate Speech Info: Adequate    SPECIAL CARE FACTORS FREQUENCY  PT (By licensed PT), OT (By licensed OT)     PT Frequency: 3 x week OT Frequency: 3 x week            Contractures Contractures Info: Not present    Additional Factors Info  Code Status, Allergies, Psychotropic Code Status Info: DNR Allergies Info: Sulfa Antibiotics Psychotropic Info: Celexa 10 mg PO daily, Trazodone 25 mg PO QHS, Haldol injection 1 mg IV every 12 hours prn, Ativan 0.25 mg IV every 6 hours prn.         Current Medications (09/30/2017):  This is the current hospital active medication list Current Facility-Administered Medications  Medication Dose Route Frequency Provider Last Rate Last Dose  . 0.9 %  sodium chloride infusion   Intravenous Continuous Regalado, Belkys A, MD 75 mL/hr at 09/30/17 0424    . acetaminophen (TYLENOL) tablet 650 mg  650 mg Oral Q6H PRN Ivor Costa, MD   650 mg at 09/26/17 2005   Or  . acetaminophen (TYLENOL) suppository 650 mg  650 mg Rectal Q6H PRN Ivor Costa, MD      . aspirin tablet 325 mg  325 mg Oral Daily Ivor Costa, MD   325 mg at 09/30/17 1017  . citalopram (CELEXA) tablet 10 mg  10 mg Oral Daily Modena Jansky, MD   10 mg at 09/30/17 1017  .  diltiazem (CARDIZEM SR) 12 hr capsule 60 mg  60 mg Oral Daily Modena Jansky, MD   60 mg at 09/30/17 1017  . donepezil (ARICEPT) tablet 10 mg  10 mg Oral QHS Ivor Costa, MD   10 mg at 09/29/17 2049  . enoxaparin (LOVENOX) injection 40 mg  40 mg Subcutaneous Q24H Ivor Costa, MD   40 mg at 09/30/17 1018  . feeding supplement (ENSURE ENLIVE) (ENSURE ENLIVE) liquid 237 mL  237 mL Oral BID BM Regalado, Belkys A, MD   237 mL at 09/30/17 1023  . haloperidol lactate (HALDOL) injection 1 mg  1 mg Intravenous Q12H PRN  Modena Jansky, MD   1 mg at 09/26/17 2110  . ondansetron (ZOFRAN) tablet 4 mg  4 mg Oral Q6H PRN Ivor Costa, MD       Or  . ondansetron [facility]) injection 4 mg  4 mg Intravenous Q6H PRN Ivor Costa, MD      . QUEtiapine (SEROQUEL) tablet 12.5 mg  12.5 mg Oral QHS Modena Jansky, MD   12.5 mg at 09/29/17 2049     Discharge Medications: Please see discharge summary for a list of discharge medications.  Relevant Imaging Results:  Relevant Lab Results:   Additional Information SS#: [id number]. Has a telesitter. Will be without for 24 hours prior to discharge to SNF. Encouraged daughter to apply for Medicaid.  Candie Chroman, LCSW

## 2017-09-30 NOTE — Progress Notes (Signed)
Again attempted to call report to [facility] at [phone] with no answer.  Beth Ramos

## 2017-09-30 NOTE — Progress Notes (Signed)
Physical Therapy Treatment Patient Details Name: Beth Ramos MRN: [id number] DOB: 09/23/1923 Today's Date: 09/30/2017    History of Present Illness 83 year old female, has been living with her daughter for the last 2 years, PMH of dementia diagnosed approximately 2 years ago and progressively worsening, hard of hearing, PAF not on anticoagulation, chronic diastolic CHF, anxiety, depression, chronic urinary frequency who presented to the ED with progressively worsening abnormal/irrational behavior and abdominal pain. As per discussion with daughter, patient quit/lost interest in activities that she liked to do almost a year ago such as crocheting, reading, knitting etc. In the last 3 months, her mental status has progressively worsened, eats well, sleeps most of the day, awake all night and keeps wandering around in the house, has even attempted to leave the house, keeps talking to herself. In the last few days, has been asking for her dog which died 3 years ago, a baby and accusing daughter that she killed her dog. She has threatened to kill her daughter's dog and wishes that her daughter was dead. In ED, hypotensive 74/44, A. fib with RVR but metabolic workup and sepsis workup negative. Reverted to SB/SR after amiodarone drip.    PT Comments    Patient making improvement with mobility and gait.  Agree with need for SNF for continued therapy at d/c.   Follow Up Recommendations  SNF;Supervision/Assistance - 24 hour     Equipment Recommendations  None recommended by PT    Recommendations for Other Services       Precautions / Restrictions Precautions Precautions: Fall Restrictions Weight Bearing Restrictions: No    Mobility  Bed Mobility Overal bed mobility: Independent                Transfers Overall transfer level: Needs assistance Equipment used: None;Rolling walker (2 wheeled) Transfers: Sit to/from [facility] Sit to Stand: Min assist Stand pivot  transfers: Min assist       General transfer comment: Patient able to transfer bed > BSC with min assist to steady.  Cues for hand placement.  Min assist to rise to stance.  Ambulation/Gait Ambulation/Gait assistance: Min assist Ambulation Distance (Feet): 34 Feet Assistive device: Rolling walker (2 wheeled) Gait Pattern/deviations: Step-through pattern;Decreased stride length;Trunk flexed   Gait velocity interpretation: Below normal speed for age/gender General Gait Details: Patient able to manage RW today and keep it close to her for safety.  Assist to steady, especially during turns.   Stairs            Wheelchair Mobility    Modified Rankin (Stroke Patients Only)       Balance           Standing balance support: Single extremity supported Standing balance-Leahy Scale: Poor                              Cognition Arousal/Alertness: Awake/alert Behavior During Therapy: WFL for tasks assessed/performed;Restless Overall Cognitive Status: History of cognitive impairments - at baseline                                        Exercises      General Comments        Pertinent Vitals/Pain Pain Assessment: No/denies pain    Home Living  Prior Function            PT Goals (current goals can now be found in the care plan section) Acute Rehab PT Goals Patient Stated Goal: unable to state Progress towards PT goals: Progressing toward goals    Frequency    Min 3X/week      PT Plan Current plan remains appropriate    Co-evaluation              AM-PAC PT "6 Clicks" Daily Activity  Outcome Measure  Difficulty turning over in bed (including adjusting bedclothes, sheets and blankets)?: None Difficulty moving from lying on back to sitting on the side of the bed? : None Difficulty sitting down on and standing up from a chair with arms (e.g., wheelchair, bedside commode, etc,.)?: Unable Help  needed moving to and from a bed to chair (including a wheelchair)?: A Little Help needed walking in hospital room?: A Little Help needed climbing 3-5 steps with a railing? : A Lot 6 Click Score: 17    End of Session   Activity Tolerance: Patient tolerated treatment well Patient left: in bed;with call bell/phone within reach;with bed alarm set Nurse Communication: Mobility status PT Visit Diagnosis: Unsteadiness on feet (R26.81);Muscle weakness (generalized) (M62.81)     Time: 3419-3790 PT Time Calculation (min) (ACUTE ONLY): 13 min  Charges:  $Gait Training: 8-22 mins                    G Codes:       Carita Pian. Sanjuana Kava, [facility] Acute Rehab Services Pager [place] 09/30/2017, 5:43 PM

## 2017-09-30 NOTE — Progress Notes (Signed)
Pt discharged to [facility] via [facility] in stable condition.  Discharge instructions and prescriptions sent with pt.  Eliezer Bottom Clark

## 2017-09-30 NOTE — Progress Notes (Signed)
Attempted to call report to [facility] with number given by CSW with no success.  Will try again later.  Beth Ramos [place]

## 2017-09-30 NOTE — Discharge Summary (Signed)
Physician Discharge Summary  Beth Ramos IOE:[id number] DOB: 24-Apr-1923 DOA: 09/24/2017  PCP: Lajean Manes, MD  Admit date: 09/24/2017 Discharge date: 09/30/2017  Admitted From: Home  Disposition:  SNF  Recommendations for Outpatient Follow-up:  1. Follow up with PCP in 1-2 weeks 2. Please obtain BMP/CBC in one week    Discharge Condition: Stable.  CODE STATUS: DNR Diet recommendation: Heart Healthy  Brief/Interim Summary: 83 year old female, has been living with her daughter for the last 2 years, PMH of dementia diagnosed approximately 2 years ago and progressively worsening, hard of hearing, PAF not on anticoagulation, chronic diastolic CHF, anxiety, depression, chronic urinary frequency who presented to the ED with progressively worsening abnormal/irrational behavior and abdominal pain. As per discussion with daughter, patient quit/lost interest in activities that she liked to do almost a year ago such as crocheting, reading, knitting etc. In the last 3 months, her mental status has progressively worsened, eats well, sleeps most of the day, awake all night and keeps wandering around in the house, has even attempted to leave the house, keeps talking to herself. In the last few days, has been asking for her dog which died 3 years ago, a baby and accusing daughter that she killed her dog. She has threatened to kill her daughter's dog and wishes that her daughter was dead. In ED, hypotensive 74/44, A. fib with RVR but metabolic workup and sepsis workup negative. Reverted to SB/SR after amiodarone drip. Cardiology consulted. Social worker consulted for nursing home placement (daughter cannot take care of her). Patient medically stable for discharge to SNF pending PT, OT evaluation and bed availability.    Assessment & Plan:   Principal Problem:   Dementia with behavioral disturbance Active Problems:   Paroxysmal a-fib (HCC)   Dementia   Dehydration   Atrial fibrillation with RVR  (HCC)   Hypotension   Acute metabolic encephalopathy   Chronic diastolic CHF (congestive heart failure) (HCC)   Palliative care by specialist    A. fib with RVR - Presented with ventricular rate in the 140s. She was also hypotensive in the ED. Unable to use Cardizem at that time. Her case was discussed with cardiologist on-call and she was started on IV amiodarone infusion. - She reverted to sinus bradycardia in the 50s-sinus rhythm, overnight on the night of admission. - Restarted home dose of Cardizem CD 60 mg daily (12 hour preparation).  - CHA2DS2-VASc Scoreis 4. Not a good anticoagulation candidate due to advanced age, progressively worsening dementia, fall risk and noted GI bleed history. - TSH normal. Free T4 mildly elevated probably of no significance. May repeat as outpatient.  - EKG 11/2 personally reviewed: Sinus rhythm at 63 bpm, normal axis, T-wave inversion in leads V1-4, no other acute changes and QTC 405 ms. -Cardiology saw and signed off. -Remains in sinus rhythm. No further workup.  -ECHO pending.  -elevation of troponin. ECHO Normal LV size with EF 55-60%. Moderate diastolic dysfunction. Normal RV size and systolic function. Mild mitral regurgitation.   Mild aortic stenosis. stable for discharge.   Dizziness;  Orthostatic vitals negative   Resolved with IV fluids.   Progressive dementia with behavioral abnormalities  - Suspect that her irrational/abnormal behavior and aggressiveness is due to progressively worsening dementia. No obvious metabolic or infectious cause noted. CT head without acute abnormalities. Low index of suspicion for acute stroke. No current pain issues. Hard of hearing may be contributing. Disturbed sleep rhythm may also be contributing. - Continue Aricept. -Psychiatric consultation appreciated. Started Celexa  10 mg daily.  -StartedSeroquel 12.5 mg at bedtime. -Much improved, less agitation and impulsiveness. -PT to evaluate again  11-07  Elevated troponin - Likely demand ischemia due to hypotension and A. fib with RVR. No chest pain reported. May not be a candidate for aggressive intervention and hence ischemic evaluation.  ECHO Normal LV size with EF 55-60%. Moderate diastolic dysfunction. Normal RV size and systolic function. Mild mitral regurgitation. Mild aortic stenosis.  Hypotension; resolved.  - May be due to dehydration and A. fib with RVR. Improved but blood pressures remain soft. Monitor closely.  - Cortisol 12.5.   Chronic diastolic CHF 2-D echo 05/24/15: LVEF 65-70 percent and grade 1 diastolic dysfunction. Compensated.  Anxiety & depression Does not seem to be on medications at home for this. TSH normal.  Reported abdominal pain -Resolved. Urine microscopy negative. CT abdomen without acute findings.  Large hiatal hernia Seen on imaging  Aortic atherosclerosis Seen on chest x-ray and CT abdomen. Continue aspirin.  Urinary frequency - ? Overactive bladder. UA negative. Urine culture negative. Blood cultures x2: Negative to date.  Suspected fall On 09/26/17 morning, RN reported that patient was seen sitting on the mat next to her low bed and suspicious for fall because bed rails were up. No injuries noted. Hemodynamically and physical exam stable. As discussed with RN, fall precautions and [employer]. PT and OT recommend SNF.      Discharge Diagnoses:  Principal Problem:   Dementia with behavioral disturbance Active Problems:   Paroxysmal a-fib (HCC)   Dementia   Dehydration   Atrial fibrillation with RVR (HCC)   Hypotension   Acute metabolic encephalopathy   Chronic diastolic CHF (congestive heart failure) ([place])   Palliative care by specialist    Discharge Instructions  Discharge Instructions    Diet - low sodium heart healthy   Complete by:  As directed    Increase activity slowly   Complete by:  As directed      Allergies as of 09/30/2017       Reactions   Sulfa Antibiotics Nausea And Vomiting      Medication List    STOP taking these medications   dimenhyDRINATE 50 MG tablet Commonly known as:  DRAMAMINE   traMADol 50 MG tablet Commonly known as:  ULTRAM   traZODone 50 MG tablet Commonly known as:  DESYREL     TAKE these medications   aspirin 325 MG tablet Take 1 tablet (325 mg total) daily by mouth. Start taking on:  10/01/2017   citalopram 10 MG tablet Commonly known as:  CELEXA Take 1 tablet (10 mg total) daily by mouth. Start taking on:  10/01/2017   diltiazem 60 MG 12 hr capsule Commonly known as:  CARDIZEM SR Take 60 mg by mouth daily.   donepezil 10 MG tablet Commonly known as:  ARICEPT Take 10 mg by mouth at bedtime.   feeding supplement (ENSURE ENLIVE) Liqd Take 237 mLs 2 (two) times daily between meals by mouth. Start taking on:  10/01/2017   multivitamin with minerals Tabs tablet Take 1 tablet by mouth daily.   QUEtiapine 25 MG tablet Commonly known as:  SEROQUEL Take 0.5 tablets (12.5 mg total) at bedtime by mouth.   vitamin B-12 100 MCG tablet Commonly known as:  CYANOCOBALAMIN Take 100 mcg by mouth daily.   vitamin C 100 MG tablet Take 100 mg by mouth daily.   VITAMIN D PO Take 1 tablet by mouth daily.       Allergies  Allergen Reactions  . Sulfa Antibiotics Nausea And Vomiting    Consultations:  Psych    Procedures/Studies: Ct Head Wo Contrast  Result Date: 09/24/2017 CLINICAL DATA:  83 year old female with altered mental status. EXAM: CT HEAD WITHOUT CONTRAST TECHNIQUE: Contiguous axial images were obtained from the base of the skull through the vertex without intravenous contrast. COMPARISON:  01/31/2017 and prior CTs FINDINGS: Brain: No evidence of acute infarction, hemorrhage, hydrocephalus, extra-axial collection or mass lesion/mass effect. Mild atrophy and moderate chronic small-vessel white matter ischemic changes again noted. Vascular: Mild intracranial  atherosclerotic calcifications noted. Skull: Normal. Negative for fracture or focal lesion. Sinuses/Orbits: A left mastoid effusion is noted without other significant abnormality Other: None IMPRESSION: 1. No evidence of acute intracranial abnormality 2. Atrophy and chronic small-vessel white matter ischemic changes 3. Left mastoid effusion Electronically Signed   By: Margarette Canada M.D.   On: 09/24/2017 18:31   Ct Abdomen Pelvis W Contrast  Result Date: 09/24/2017 CLINICAL DATA:  83 year old female with abdominal and pelvic pain. History of lymphoma. EXAM: CT ABDOMEN AND PELVIS WITH CONTRAST TECHNIQUE: Multidetector CT imaging of the abdomen and pelvis was performed using the standard protocol following bolus administration of intravenous contrast. CONTRAST:  80 cc intravenous Isovue-300 COMPARISON:  12/08/2016 CT FINDINGS: Patient motion artifact decreases sensitivity. Lower chest: No acute abnormality Hepatobiliary: The liver and gallbladder are within normal limits. Biliary dilatation is unchanged with CBD measuring 12 mm. Intrahepatic biliary dilatation is unchanged. Pancreas: Unremarkable Spleen: Unremarkable Adrenals/Urinary Tract: The kidneys, adrenal glands and bladder are unremarkable. Stomach/Bowel: A large hiatal hernia is again noted. Colonic diverticulosis identified without evidence of diverticulitis. No definite bowel wall thickening or inflammatory changes. There is no evidence bowel obstruction. Vascular/Lymphatic: Aortic atherosclerosis. No enlarged abdominal or pelvic lymph nodes. Reproductive: Status post hysterectomy. No adnexal masses. Other: No ascites, focal collection or pneumoperitoneum. Musculoskeletal: No acute abnormality or suspicious focal bony lesion. Degenerative changes within the visualized spine again noted. T12 compression fracture is unchanged. IMPRESSION: 1. No evidence of acute abnormality 2. Unchanged biliary dilatation 3. Large hiatal hernia 4.  Aortic Atherosclerosis  (ICD10-I70.0). Electronically Signed   By: Margarette Canada M.D.   On: 09/24/2017 18:40   Dg Chest Port 1 View  Result Date: 09/24/2017 CLINICAL DATA:  Altered level of consciousness. EXAM: PORTABLE CHEST 1 VIEW COMPARISON:  PA and lateral chest 12/08/2016. Single-view of the chest 07/12/2012. FINDINGS: Lungs are clear. Large hiatal hernia is noted. Heart size is normal. No pneumothorax or pleural effusion. Multiple remote right rib fractures are seen. Atherosclerosis is identified. IMPRESSION: No acute disease. Large hiatal hernia. Atherosclerosis. Electronically Signed   By: Inge Rise M.D.   On: 09/24/2017 16:09       Subjective: She report improvement of dizziness. Per nurse patient has been more calm.   Discharge Exam: Vitals:   09/30/17 1017 09/30/17 1450  BP: (!) 135/53 (!) 132/58  Pulse: 68 81  Resp: 18   Temp:  98.3 F (36.8 C)  SpO2: 98% 94%   Vitals:   09/29/17 2044 09/30/17 0526 09/30/17 1017 09/30/17 1450  BP: (!) 159/58 124/66 (!) 135/53 (!) 132/58  Pulse: 65 66 68 81  Resp: 17 17 18    Temp: 98.2 F (36.8 C) 98.1 F (36.7 C)  98.3 F (36.8 C)  TempSrc: Oral Axillary  Oral  SpO2: 96% 95% 98% 94%  Weight:      Height:        General: Pt is alert, awake, not in acute distress  Cardiovascular: RRR, S1/S2 +, no rubs, no gallops Respiratory: CTA bilaterally, no wheezing, no rhonchi Abdominal: Soft, NT, ND, bowel sounds + Extremities: no edema, no cyanosis    The results of significant diagnostics from this hospitalization (including imaging, microbiology, ancillary and laboratory) are listed below for reference.     Microbiology: Recent Results (from the past 240 hour(s))  Urine culture     Status: None   Collection Time: 09/24/17  5:54 PM  Result Value Ref Range Status   Specimen Description IN/OUT CATH URINE  Final   Special Requests NONE  Final   Culture NO GROWTH  Final   Report Status 09/25/2017 FINAL  Final  Culture, blood (Routine X 2) w Reflex  to ID Panel     Status: None   Collection Time: 09/24/17  6:41 PM  Result Value Ref Range Status   Specimen Description BLOOD BLOOD LEFT FOREARM  Final   Special Requests   Final    BOTTLES DRAWN AEROBIC AND ANAEROBIC Blood Culture adequate volume   Culture NO GROWTH 5 DAYS  Final   Report Status 09/29/2017 FINAL  Final  Culture, blood (Routine X 2) w Reflex to ID Panel     Status: None   Collection Time: 09/24/17  6:55 PM  Result Value Ref Range Status   Specimen Description BLOOD LEFT HAND  Final   Special Requests   Final    BOTTLES DRAWN AEROBIC AND ANAEROBIC Blood Culture adequate volume   Culture NO GROWTH 5 DAYS  Final   Report Status 09/29/2017 FINAL  Final  MRSA PCR Screening     Status: None   Collection Time: 09/25/17  1:07 AM  Result Value Ref Range Status   MRSA by PCR NEGATIVE NEGATIVE Final    Comment:        The GeneXpert MRSA Assay (FDA approved for NASAL specimens only), is one component of a comprehensive MRSA colonization surveillance program. It is not intended to diagnose MRSA infection nor to guide or monitor treatment for MRSA infections.      Labs: BNP (last 3 results) Recent Labs    12/08/16 0924 09/25/17 0345  BNP 63.4 527.7*   Basic Metabolic Panel: Recent Labs  Lab 09/24/17 1623 09/25/17 0819 09/29/17 1246  NA 139 138 140  K 4.3 3.9 3.6  CL 105 109 108  CO2 26 23 28   GLUCOSE 97 81 97  BUN 16 22* 14  CREATININE 0.71 0.72 0.66  CALCIUM 9.0 8.6* 8.7*  MG 1.7  --   --    Liver Function Tests: Recent Labs  Lab 09/24/17 1623  AST 31  ALT 29  ALKPHOS 75  BILITOT 1.0  PROT 5.3*  ALBUMIN 3.2*   No results for input(s): LIPASE, AMYLASE in the last 168 hours. No results for input(s): AMMONIA in the last 168 hours. CBC: Recent Labs  Lab 09/24/17 1623 09/25/17 0819 09/29/17 1246  WBC 8.6 8.3 8.9  NEUTROABS 5.3  --   --   HGB 12.8 12.0 11.8*  HCT 38.7 36.6 35.6*  MCV 97.7 97.9 98.3  PLT 152 164 174   Cardiac  Enzymes: Recent Labs  Lab 09/24/17 2027 09/25/17 0345 09/25/17 0819  TROPONINI 0.08* 0.88* 1.09*   BNP: Invalid input(s): POCBNP CBG: No results for input(s): GLUCAP in the last 168 hours. D-Dimer No results for input(s): DDIMER in the last 72 hours. Hgb A1c No results for input(s): HGBA1C in the last 72 hours. Lipid Profile No results for input(s): CHOL,  HDL, LDLCALC, TRIG, CHOLHDL, LDLDIRECT in the last 72 hours. Thyroid function studies No results for input(s): TSH, T4TOTAL, T3FREE, THYROIDAB in the last 72 hours.  Invalid input(s): FREET3 Anemia work up No results for input(s): VITAMINB12, FOLATE, FERRITIN, TIBC, IRON, RETICCTPCT in the last 72 hours. Urinalysis    Component Value Date/Time   COLORURINE YELLOW 09/24/2017 1744   APPEARANCEUR CLEAR 09/24/2017 1744   LABSPEC 1.013 09/24/2017 1744   PHURINE 6.0 09/24/2017 1744   GLUCOSEU NEGATIVE 09/24/2017 1744   HGBUR NEGATIVE 09/24/2017 1744   BILIRUBINUR NEGATIVE 09/24/2017 1744   KETONESUR NEGATIVE 09/24/2017 1744   PROTEINUR NEGATIVE 09/24/2017 1744   UROBILINOGEN 1.0 07/12/2012 0518   NITRITE NEGATIVE 09/24/2017 1744   LEUKOCYTESUR NEGATIVE 09/24/2017 1744   Sepsis Labs Invalid input(s): PROCALCITONIN,  WBC,  LACTICIDVEN Microbiology Recent Results (from the past 240 hour(s))  Urine culture     Status: None   Collection Time: 09/24/17  5:54 PM  Result Value Ref Range Status   Specimen Description IN/OUT CATH URINE  Final   Special Requests NONE  Final   Culture NO GROWTH  Final   Report Status 09/25/2017 FINAL  Final  Culture, blood (Routine X 2) w Reflex to ID Panel     Status: None   Collection Time: 09/24/17  6:41 PM  Result Value Ref Range Status   Specimen Description BLOOD BLOOD LEFT FOREARM  Final   Special Requests   Final    BOTTLES DRAWN AEROBIC AND ANAEROBIC Blood Culture adequate volume   Culture NO GROWTH 5 DAYS  Final   Report Status 09/29/2017 FINAL  Final  Culture, blood (Routine X 2)  w Reflex to ID Panel     Status: None   Collection Time: 09/24/17  6:55 PM  Result Value Ref Range Status   Specimen Description BLOOD LEFT HAND  Final   Special Requests   Final    BOTTLES DRAWN AEROBIC AND ANAEROBIC Blood Culture adequate volume   Culture NO GROWTH 5 DAYS  Final   Report Status 09/29/2017 FINAL  Final  MRSA PCR Screening     Status: None   Collection Time: 09/25/17  1:07 AM  Result Value Ref Range Status   MRSA by PCR NEGATIVE NEGATIVE Final    Comment:        The GeneXpert MRSA Assay (FDA approved for NASAL specimens only), is one component of a comprehensive MRSA colonization surveillance program. It is not intended to diagnose MRSA infection nor to guide or monitor treatment for MRSA infections.      Time coordinating discharge: Over 30 minutes  SIGNED:   Elmarie Shiley, MD  [facility] 09/30/2017, 4:17 PM Pager [phone]  If 7PM-7AM, please contact night-coverage [email or web address] Password [email or web address]

## 2017-09-30 NOTE — Social Work (Addendum)
CSW spoke with daughter again who indicated she would like for patient to [facility], [facility] or [facility].  CSW discussed the up front cost $5800 that Freda Munro in admissions at [facility] advised. Daughter not sure if she wants [facility] as she wants SNF closer to her.    Daughter indicated [facility] as another facility placement that she would consider and CSW was advised by [facility] in admission that the upfront cost would be $7950 up front.  CSW contacted daughter to discuss and she indicated that she would like the admission staff at [facility] to contact her to discuss. CSW provided [place] her contact information to follow up directly with daughter.  3:00pm- CSW called [facility] and spoke with admissions who indicated that she did not receive initial referral that CSW sent thru hub. CSW resent and will f/u.  Daughter is [place] today.  [facility] inquired about the telesitter. CSW advised RN Mendel Ryder that telesitter dc'd a few days ago.  CSW left message for Mickel Baas in admissions at [facility]  3:34pm Mickel Baas from [facility] called back and indicated that they do not accept medicaid for placement.  4:03pm family selected [facility] for nursing placement. CSW messaged doctor on discharge summary.  CSW will continue to follow.  Elissa Hefty, LCSW Clinical Social Worker [phone]

## 2017-09-30 NOTE — Social Work (Addendum)
CSW reviewed notes from CSW-Liz.  CSW contacted daughter to advise that CSW would be covering for CSW for the afternoon. CSW advised patient that [facility] declined to take patient. CSW inquired of [facility] and [facility] and daughter indicated that she did not follow up with either facility. CSW sent initial referral through hub and will f/u. CSW advised patient that doctor is looking to discharge patient today. CSW discussed other facilities and daughter indicated that she needs a facility that is close to her home. CSW validated and advised that patient would not be able to stay in hospital until a facility is located that is closer to her home.  CSW will f/u.  Elissa Hefty, LCSW Clinical Social Worker [phone]

## 2017-09-30 NOTE — Progress Notes (Signed)
CSW following to facilitate discharge planning. CSW spoke with Admission at [facility] to see if the facility would be able to offer a bed for the patient to private pay for SNF and have availability in their locked dementia unit. Admissions at [facility] said they were running the numbers, and it would be approximately $5800 for the month, plus medications, but the facility was still looking to see what all could be covered under the patient's insurance in terms of medical visits and medications. Admissions at [facility] will call back with additional information.  CSW checked in with Admissions at [facility], and they do have availability for ALF at this time. CSW obtained information on what they need in order to admit patients, and CSW faxed over some information to start the referral. [facility] will have to do an assessment of the patient first in order to determine needs, and then will work with the family on determining pricing if the patient is appropriate.  CSW will continue to follow.  Laveda Abbe, [place] Clinical Social Worker [phone]

## 2017-09-30 NOTE — Clinical Social Work Note (Signed)
Referral made to Glyn Ade, clinical liaison for [facility]F's per request from unit CSW. Dorian Pod will follow up tomorrow morning.  Dayton Scrape, [place]

## 2017-10-01 ENCOUNTER — Other Ambulatory Visit: Payer: Self-pay | Admitting: Licensed Clinical Social Worker

## 2017-10-01 NOTE — Patient Outreach (Signed)
[facility]) Care Management  [facility] Social Work  10/01/2017  B[facility] Ramos January 23, 1923 [id number]  Encounter Medications:  Outpatient Encounter Medications as of 10/01/2017  Medication Sig  . Ascorbic Acid (VITAMIN C) 100 MG tablet Take 100 mg by mouth daily.  Marland Kitchen aspirin 325 MG tablet Take 1 tablet (325 mg total) daily by mouth.  . Cholecalciferol (VITAMIN D PO) Take 1 tablet by mouth daily.  . citalopram (CELEXA) 10 MG tablet Take 1 tablet (10 mg total) daily by mouth.  . diltiazem (CARDIZEM SR) 60 MG 12 hr capsule Take 60 mg by mouth daily.  Marland Kitchen donepezil (ARICEPT) 10 MG tablet Take 10 mg by mouth at bedtime.  . feeding supplement, ENSURE ENLIVE, (ENSURE ENLIVE) LIQD Take 237 mLs 2 (two) times daily between meals by mouth.  . Multiple Vitamin (MULTIVITAMIN WITH MINERALS) TABS tablet Take 1 tablet by mouth daily.  . QUEtiapine (SEROQUEL) 25 MG tablet Take 0.5 tablets (12.5 mg total) at bedtime by mouth.  . vitamin B-12 (CYANOCOBALAMIN) 100 MCG tablet Take 100 mcg by mouth daily.   No facility-administered encounter medications on file as of 10/01/2017.     Functional Status:  In your present state of health, do you have any difficulty performing the following activities: 10/01/2017 09/25/2017  Hearing? N N  Vision? N N  Difficulty concentrating or making decisions? Y Y  Comment - -  Walking or climbing stairs? Y Y  Dressing or bathing? N N  Doing errands, shopping? [facility]  Some recent data might be hidden    Fall/Depression Screening:  PHQ 2/9 Scores 10/01/2017  PHQ - 2 Score 1    Assessment: CSW arrived at [facility] in order to complete SNF visit. Patient was in her room asleep when CSW arrived. However, was able to wake up and answer assessment questions. Patient is a 83 year old divorced female with worsening dementia. Patient was very calm and pleasant during home visit whereas she was very combative and anxious during hospitalization. She reports being  more comfortable at SNF than she was at [facility]. She reports that her daughter came and visited her yesterday when she arrived at [facility]. Patient reports that she would prefer to eventually return back home. Patient denies being in any pain or discomfort at this time. She shares that her main issue is that she is never hungry or thirsty. She reports that she does like Boost and has been trying to drink them more. CSW completed review of senior resources available to her within the community. Handout was provided to her for her to keep. Patient denies any transportation concerns. Patient's neighbor across the hall came in and visited with patient at the end of the session until lunch came.   CSW met with SNF social worker [facility] and updated her on case. [facility] has not met with patient yet as she just arrived yesterday. However, CSW informed her that family are looking for LTC placement at facility and daughter has already applied for LTC Medicaid. SNF social worker was agreeable to update [facility] CSW as needed.  [facility] CM Care Plan Problem One     Most Recent Value  Care Plan Problem One  SNF admission due to mobility concerns and worsening dementia  Role Documenting the Problem One  Clinical Social Worker  Care Plan for Problem One  Active  [facility] Long Term Goal   Patient will be placed at nursing facility for long term care within 90 days as evidenced by  caregiver burnout and worsening dementia  [facility] Long Term Goal Start Date  10/01/17  Interventions for Problem One Long Term Goal  CSW completed SNF visit at [facility] and met with patient. CSW will coordinate between patient, SNF social worker, daughter to ensure that patient is able to get successfully placed at facility. CSW met with SNF social worker today and discussed case. Daughter has already applied for LTC Medicaid. CSW will continue to support and educate family.  [facility] CM Short Term Goal #1   Patient will attend all scheduled medical appointments within 30  days as evidenced by family  [facility] CM Short Term Goal #1 Start Date  10/01/17  Interventions for Short Term Goal #1  CSW completed SNF visit and educated patient on the importance of attending medical appointments both within the facility and outside to aide in her overall well being and health. CSW will continue to provide motivational interviewing interventions as needed     Plan: CSW will follow up with SNF within 8 days. CSW will continue to assist pt and family with all social work needs.   [facility], BSW, MSW, [email or web address] Phone: [phone] Fax: [phone]

## 2017-10-02 DIAGNOSIS — I509 Heart failure, unspecified: Secondary | ICD-10-CM | POA: Diagnosis not present

## 2017-10-02 DIAGNOSIS — Z7409 Other reduced mobility: Secondary | ICD-10-CM | POA: Diagnosis not present

## 2017-10-02 DIAGNOSIS — F0390 Unspecified dementia without behavioral disturbance: Secondary | ICD-10-CM | POA: Diagnosis not present

## 2017-10-02 DIAGNOSIS — I4891 Unspecified atrial fibrillation: Secondary | ICD-10-CM | POA: Diagnosis not present

## 2017-10-07 DIAGNOSIS — Z7409 Other reduced mobility: Secondary | ICD-10-CM | POA: Diagnosis not present

## 2017-10-07 DIAGNOSIS — F0390 Unspecified dementia without behavioral disturbance: Secondary | ICD-10-CM | POA: Diagnosis not present

## 2017-10-07 DIAGNOSIS — I509 Heart failure, unspecified: Secondary | ICD-10-CM | POA: Diagnosis not present

## 2017-10-07 DIAGNOSIS — I4891 Unspecified atrial fibrillation: Secondary | ICD-10-CM | POA: Diagnosis not present

## 2017-10-08 DIAGNOSIS — I4891 Unspecified atrial fibrillation: Secondary | ICD-10-CM | POA: Diagnosis not present

## 2017-10-08 DIAGNOSIS — I509 Heart failure, unspecified: Secondary | ICD-10-CM | POA: Diagnosis not present

## 2017-10-08 DIAGNOSIS — Z7409 Other reduced mobility: Secondary | ICD-10-CM | POA: Diagnosis not present

## 2017-10-08 DIAGNOSIS — F0390 Unspecified dementia without behavioral disturbance: Secondary | ICD-10-CM | POA: Diagnosis not present

## 2017-10-09 ENCOUNTER — Other Ambulatory Visit: Payer: Self-pay | Admitting: Licensed Clinical Social Worker

## 2017-10-09 NOTE — Patient Outreach (Signed)
[place] [facility]) Care Management  10/09/2017  B[facility] Ramos 1923-05-24 [id number]  Assessment- CSW completed call to [facility], [facility] social worker. She reports that patient is doing very well and adjusting to SNF appropriately. She shares that they have availability to place patient long term and that is the current plan. Social worker reports that she met with daughter yesterday and that daughter is working on finalizing patient's LTC Medicaid. CSW appreciative of update.  Plan-CSW will follow up with SNF within two weeks.  [facility], BSW, MSW, [email or web address] Phone: [phone] Fax: [phone]

## 2017-10-12 DIAGNOSIS — I4891 Unspecified atrial fibrillation: Secondary | ICD-10-CM | POA: Diagnosis not present

## 2017-10-12 DIAGNOSIS — F0390 Unspecified dementia without behavioral disturbance: Secondary | ICD-10-CM | POA: Diagnosis not present

## 2017-10-12 DIAGNOSIS — I509 Heart failure, unspecified: Secondary | ICD-10-CM | POA: Diagnosis not present

## 2017-10-14 ENCOUNTER — Other Ambulatory Visit: Payer: Self-pay | Admitting: Licensed Clinical Social Worker

## 2017-10-14 NOTE — Patient Outreach (Signed)
[place] [facility]) Care Management  10/14/2017  Beth Ramos [birth year] [id number]  Assessment- CSW completed call to [name] and spoke to discharge planner [name]. CSW confirmed that patient will be at [name] long term. LTC Medicaid has been finalized as well.   Plan-CSW will complete case closure and notify PCP and [name] Management Assistant.  [name], BSW, MSW, [email or web address] Phone: [phone] Fax: [phone]

## 2017-10-15 DIAGNOSIS — Z9181 History of falling: Secondary | ICD-10-CM | POA: Diagnosis not present

## 2017-10-15 DIAGNOSIS — Z7409 Other reduced mobility: Secondary | ICD-10-CM | POA: Diagnosis not present

## 2017-10-19 DIAGNOSIS — F0390 Unspecified dementia without behavioral disturbance: Secondary | ICD-10-CM | POA: Diagnosis not present

## 2017-10-19 DIAGNOSIS — I509 Heart failure, unspecified: Secondary | ICD-10-CM | POA: Diagnosis not present

## 2017-10-19 DIAGNOSIS — I4891 Unspecified atrial fibrillation: Secondary | ICD-10-CM | POA: Diagnosis not present

## 2017-10-20 DIAGNOSIS — R2689 Other abnormalities of gait and mobility: Secondary | ICD-10-CM | POA: Diagnosis not present

## 2017-10-20 DIAGNOSIS — M6281 Muscle weakness (generalized): Secondary | ICD-10-CM | POA: Diagnosis not present

## 2017-10-26 DIAGNOSIS — R2689 Other abnormalities of gait and mobility: Secondary | ICD-10-CM | POA: Diagnosis not present

## 2017-10-26 DIAGNOSIS — M6281 Muscle weakness (generalized): Secondary | ICD-10-CM | POA: Diagnosis not present

## 2017-10-27 DIAGNOSIS — Z9181 History of falling: Secondary | ICD-10-CM | POA: Diagnosis not present

## 2017-10-27 DIAGNOSIS — I509 Heart failure, unspecified: Secondary | ICD-10-CM | POA: Diagnosis not present

## 2017-10-27 DIAGNOSIS — I4891 Unspecified atrial fibrillation: Secondary | ICD-10-CM | POA: Diagnosis not present

## 2017-10-29 DIAGNOSIS — R32 Unspecified urinary incontinence: Secondary | ICD-10-CM | POA: Diagnosis not present

## 2017-10-29 DIAGNOSIS — F0390 Unspecified dementia without behavioral disturbance: Secondary | ICD-10-CM | POA: Diagnosis not present

## 2017-10-29 DIAGNOSIS — I509 Heart failure, unspecified: Secondary | ICD-10-CM | POA: Diagnosis not present

## 2017-10-29 DIAGNOSIS — Z7409 Other reduced mobility: Secondary | ICD-10-CM | POA: Diagnosis not present

## 2017-11-05 DIAGNOSIS — M545 Low back pain: Secondary | ICD-10-CM | POA: Diagnosis not present

## 2017-11-05 DIAGNOSIS — M546 Pain in thoracic spine: Secondary | ICD-10-CM | POA: Diagnosis not present

## 2017-11-05 DIAGNOSIS — M549 Dorsalgia, unspecified: Secondary | ICD-10-CM | POA: Diagnosis not present

## 2017-11-05 DIAGNOSIS — W19XXXA Unspecified fall, initial encounter: Secondary | ICD-10-CM | POA: Diagnosis not present

## 2017-11-10 DIAGNOSIS — F0390 Unspecified dementia without behavioral disturbance: Secondary | ICD-10-CM | POA: Diagnosis not present

## 2017-11-10 DIAGNOSIS — R2689 Other abnormalities of gait and mobility: Secondary | ICD-10-CM | POA: Diagnosis not present

## 2017-11-10 DIAGNOSIS — M545 Low back pain: Secondary | ICD-10-CM | POA: Diagnosis not present

## 2017-11-11 DIAGNOSIS — R2689 Other abnormalities of gait and mobility: Secondary | ICD-10-CM | POA: Diagnosis not present

## 2017-11-11 DIAGNOSIS — F0390 Unspecified dementia without behavioral disturbance: Secondary | ICD-10-CM | POA: Diagnosis not present

## 2017-11-11 DIAGNOSIS — M545 Low back pain: Secondary | ICD-10-CM | POA: Diagnosis not present

## 2017-11-12 DIAGNOSIS — F0390 Unspecified dementia without behavioral disturbance: Secondary | ICD-10-CM | POA: Diagnosis not present

## 2017-11-12 DIAGNOSIS — M545 Low back pain: Secondary | ICD-10-CM | POA: Diagnosis not present

## 2017-11-12 DIAGNOSIS — R2689 Other abnormalities of gait and mobility: Secondary | ICD-10-CM | POA: Diagnosis not present

## 2017-11-19 DIAGNOSIS — R2689 Other abnormalities of gait and mobility: Secondary | ICD-10-CM | POA: Diagnosis not present

## 2017-11-19 DIAGNOSIS — F0390 Unspecified dementia without behavioral disturbance: Secondary | ICD-10-CM | POA: Diagnosis not present

## 2017-11-19 DIAGNOSIS — M545 Low back pain: Secondary | ICD-10-CM | POA: Diagnosis not present

## 2017-11-25 DIAGNOSIS — M6281 Muscle weakness (generalized): Secondary | ICD-10-CM | POA: Diagnosis not present

## 2017-11-25 DIAGNOSIS — R2689 Other abnormalities of gait and mobility: Secondary | ICD-10-CM | POA: Diagnosis not present

## 2017-11-25 DIAGNOSIS — R1312 Dysphagia, oropharyngeal phase: Secondary | ICD-10-CM | POA: Diagnosis not present

## 2017-11-25 DIAGNOSIS — R41841 Cognitive communication deficit: Secondary | ICD-10-CM | POA: Diagnosis not present

## 2017-12-07 DIAGNOSIS — F419 Anxiety disorder, unspecified: Secondary | ICD-10-CM | POA: Diagnosis not present

## 2017-12-07 DIAGNOSIS — I509 Heart failure, unspecified: Secondary | ICD-10-CM | POA: Diagnosis not present

## 2017-12-07 DIAGNOSIS — F0390 Unspecified dementia without behavioral disturbance: Secondary | ICD-10-CM | POA: Diagnosis not present

## 2017-12-07 DIAGNOSIS — I4891 Unspecified atrial fibrillation: Secondary | ICD-10-CM | POA: Diagnosis not present

## 2017-12-14 DIAGNOSIS — F0390 Unspecified dementia without behavioral disturbance: Secondary | ICD-10-CM | POA: Diagnosis not present

## 2017-12-14 DIAGNOSIS — M545 Low back pain: Secondary | ICD-10-CM | POA: Diagnosis not present

## 2017-12-14 DIAGNOSIS — R2689 Other abnormalities of gait and mobility: Secondary | ICD-10-CM | POA: Diagnosis not present

## 2017-12-18 DIAGNOSIS — I509 Heart failure, unspecified: Secondary | ICD-10-CM | POA: Diagnosis not present

## 2018-01-01 DIAGNOSIS — M545 Low back pain: Secondary | ICD-10-CM | POA: Diagnosis not present

## 2018-01-01 DIAGNOSIS — F0390 Unspecified dementia without behavioral disturbance: Secondary | ICD-10-CM | POA: Diagnosis not present

## 2018-01-01 DIAGNOSIS — R2689 Other abnormalities of gait and mobility: Secondary | ICD-10-CM | POA: Diagnosis not present

## 2018-01-04 DIAGNOSIS — I509 Heart failure, unspecified: Secondary | ICD-10-CM | POA: Diagnosis not present

## 2018-01-04 DIAGNOSIS — F419 Anxiety disorder, unspecified: Secondary | ICD-10-CM | POA: Diagnosis not present

## 2018-01-04 DIAGNOSIS — F0390 Unspecified dementia without behavioral disturbance: Secondary | ICD-10-CM | POA: Diagnosis not present

## 2018-01-04 DIAGNOSIS — I4891 Unspecified atrial fibrillation: Secondary | ICD-10-CM | POA: Diagnosis not present

## 2018-01-07 DIAGNOSIS — M545 Low back pain: Secondary | ICD-10-CM | POA: Diagnosis not present

## 2018-01-07 DIAGNOSIS — M549 Dorsalgia, unspecified: Secondary | ICD-10-CM | POA: Diagnosis not present

## 2018-01-07 DIAGNOSIS — F0390 Unspecified dementia without behavioral disturbance: Secondary | ICD-10-CM | POA: Diagnosis not present

## 2018-01-07 DIAGNOSIS — R296 Repeated falls: Secondary | ICD-10-CM | POA: Diagnosis not present

## 2018-01-07 DIAGNOSIS — R2689 Other abnormalities of gait and mobility: Secondary | ICD-10-CM | POA: Diagnosis not present

## 2018-01-07 DIAGNOSIS — M6281 Muscle weakness (generalized): Secondary | ICD-10-CM | POA: Diagnosis not present

## 2018-01-07 DIAGNOSIS — R41841 Cognitive communication deficit: Secondary | ICD-10-CM | POA: Diagnosis not present

## 2018-01-07 DIAGNOSIS — R1312 Dysphagia, oropharyngeal phase: Secondary | ICD-10-CM | POA: Diagnosis not present

## 2018-01-12 DIAGNOSIS — R296 Repeated falls: Secondary | ICD-10-CM | POA: Diagnosis not present

## 2018-01-12 DIAGNOSIS — F0390 Unspecified dementia without behavioral disturbance: Secondary | ICD-10-CM | POA: Diagnosis not present

## 2018-01-14 IMAGING — CT CT HEAD W/O CM
3 series · 15 of 47 positions shown, 18 images · non-contrast
Comparison: MRI brain dated 01/07/2016

CLINICAL DATA: Unwitnessed fall, laceration to left temple,
headache, neck pain, history of dementia

EXAM:
CT HEAD WITHOUT CONTRAST
CT CERVICAL SPINE WITHOUT CONTRAST
TECHNIQUE: Multidetector CT imaging of the head and cervical spine was
performed following the standard protocol without intravenous
contrast. Multiplanar CT image reconstructions of the cervical spine
were also generated.

[Series 3: c-spine st · axial · 0.29mm/px · z∈[+1156,+1282]mm · 9 of 72 slices shown, 12 images]
[im 5/72  brain]
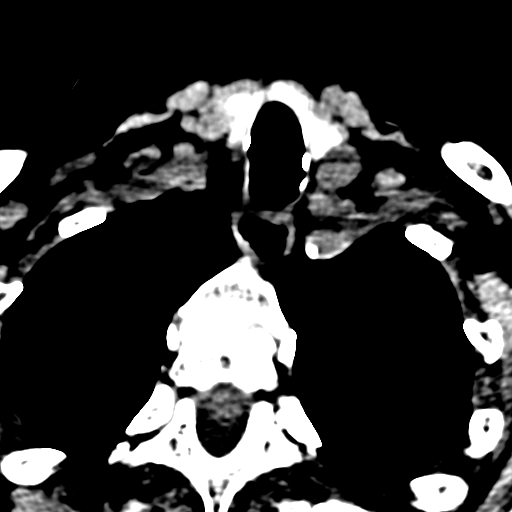
[im 5/72  bone]
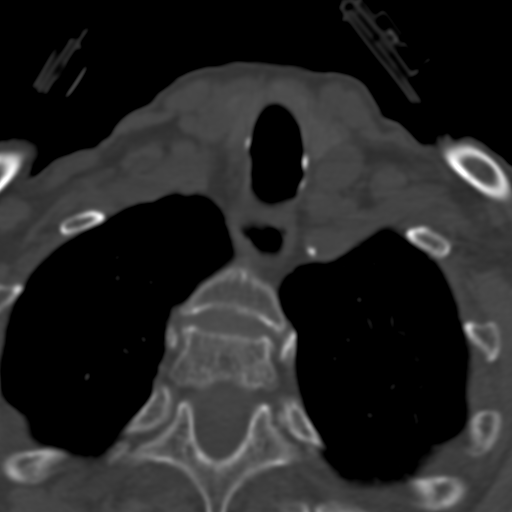
[im 13/72  brain]
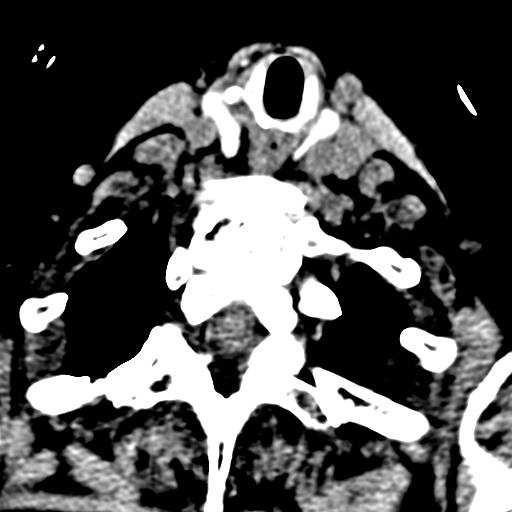
[im 20/72  brain]
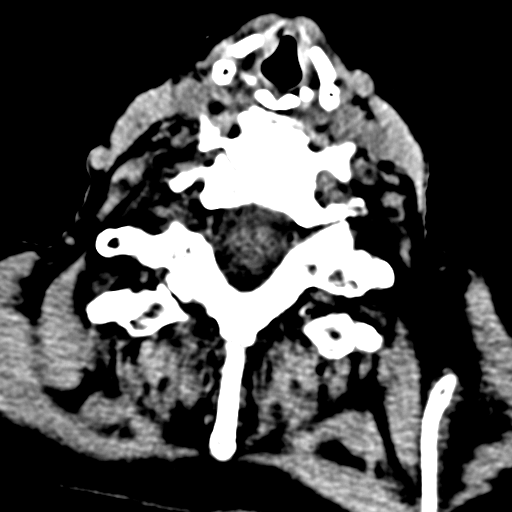
[im 27/72  brain]
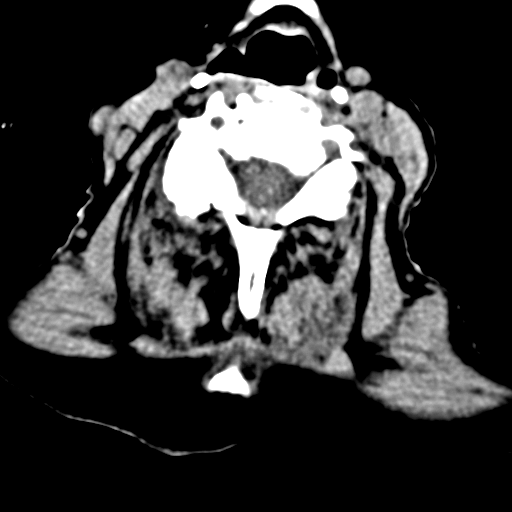
[im 37/72  brain]
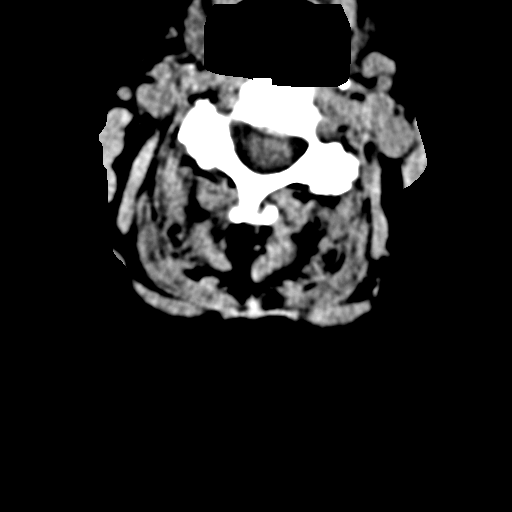
[im 37/72  bone]
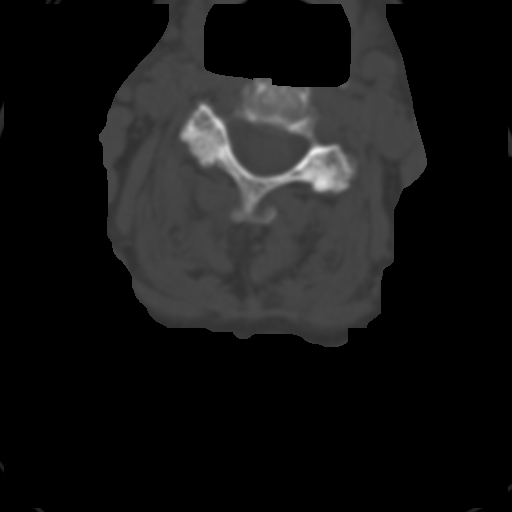
[im 45/72  brain]
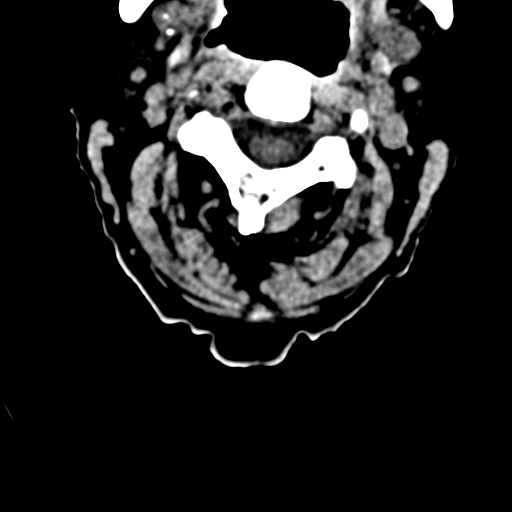
[im 52/72  brain]
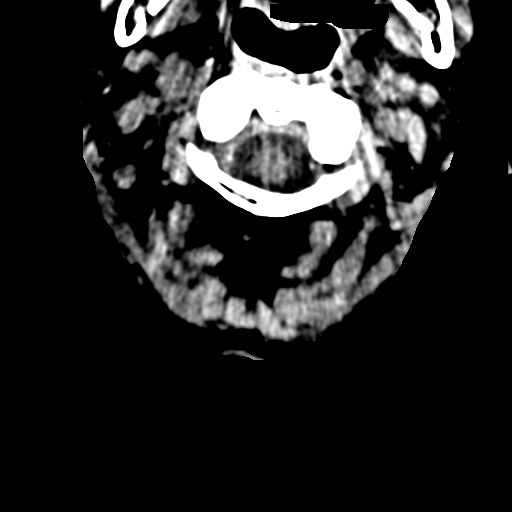
[im 59/72  brain]
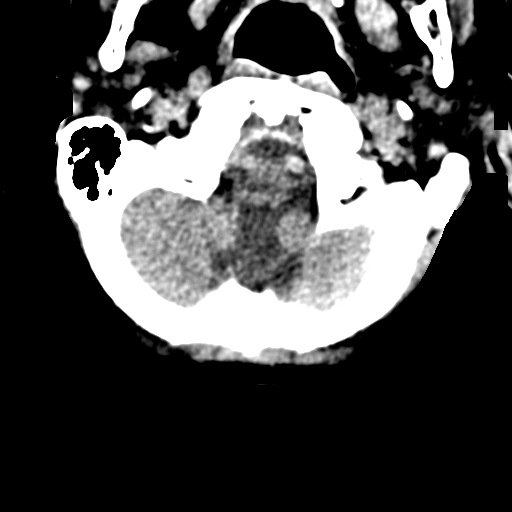
[im 67/72  brain]
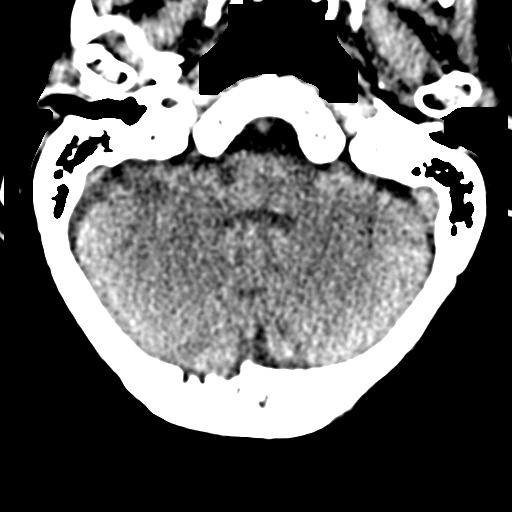
[im 67/72  bone]
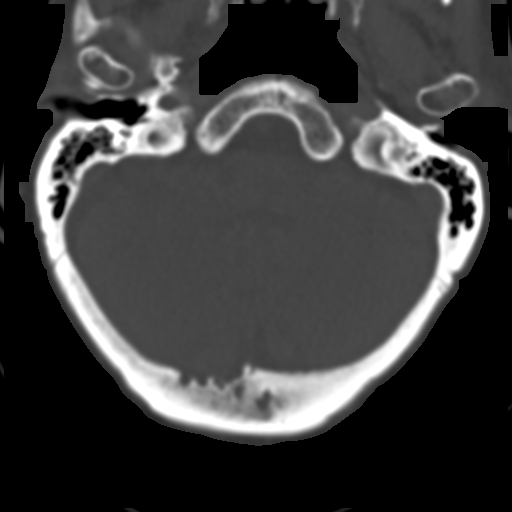

[Series 8: coronal · coronal · 0.23mm/px · 3 of 61 slices shown]
[im 21/61  brain]
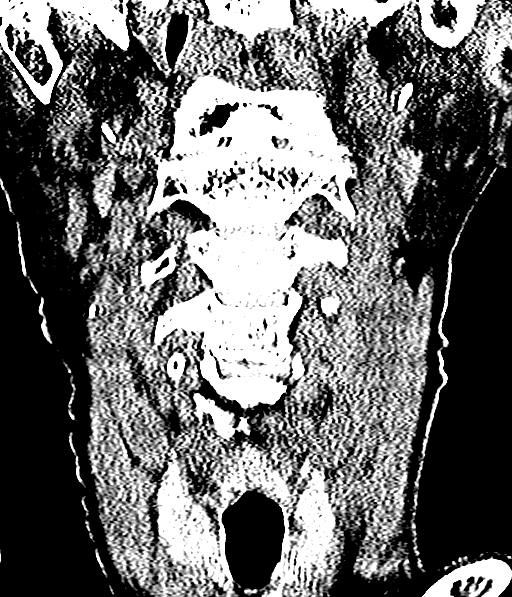
[im 27/61  brain]
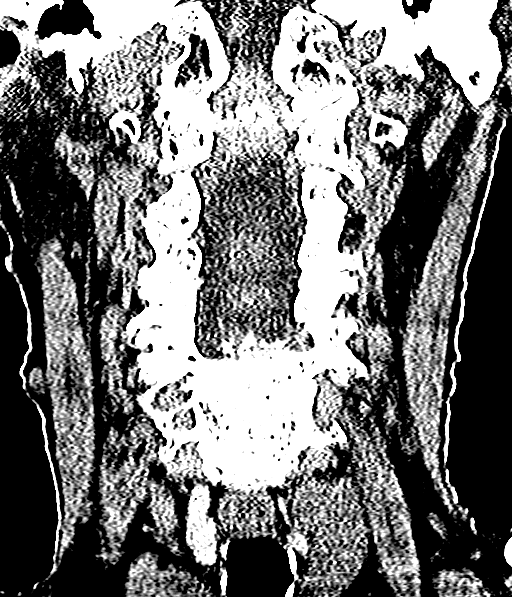
[im 34/61  brain]
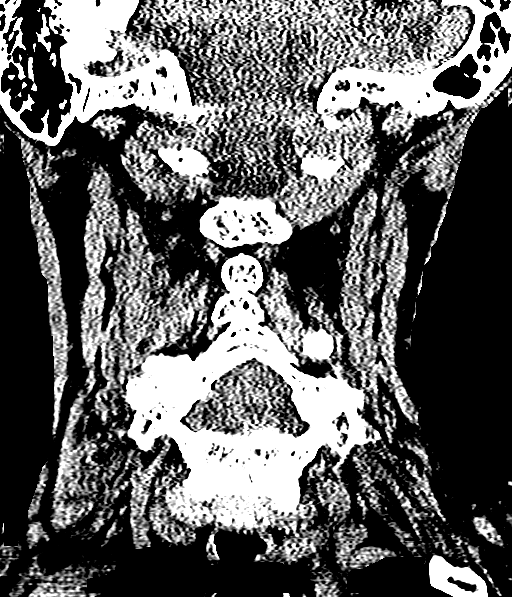

[Series 9: sagittal · sagittal · 0.23mm/px · 3 of 61 slices shown]
[im 21/61  brain]
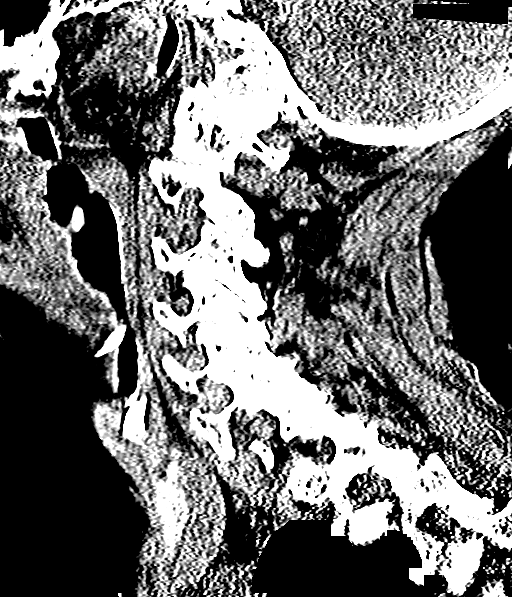
[im 31/61  brain]
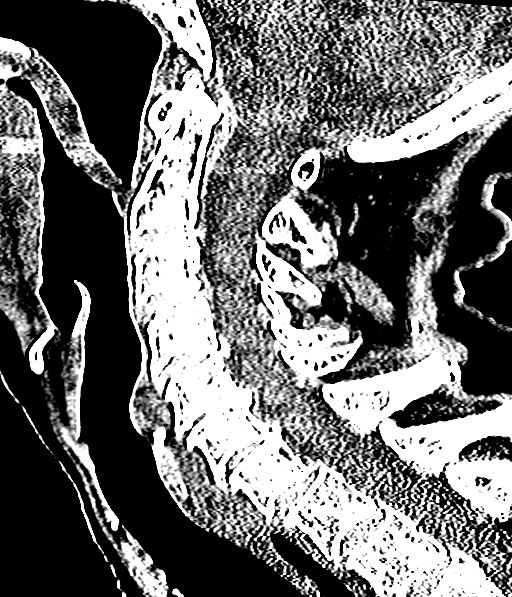
[im 41/61  brain]
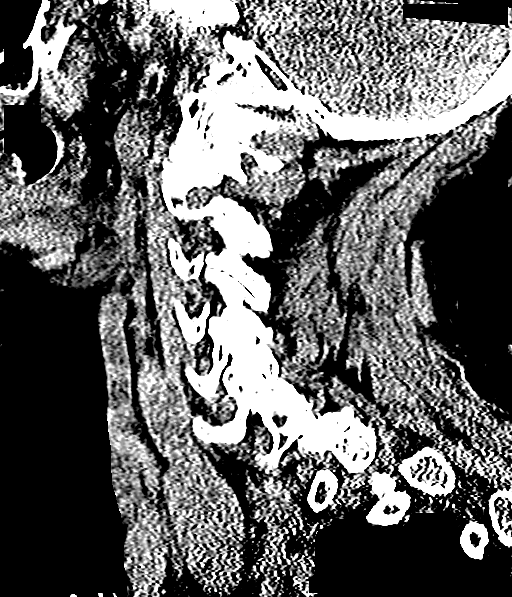

[15 of 47 positions shown; findings below may reference images not displayed]

FINDINGS: CT HEAD FINDINGS

Brain: No evidence of acute infarction, hemorrhage, hydrocephalus,
extra-axial collection or mass lesion/mass effect.

Subcortical white matter and periventricular small vessel ischemic
changes.

Global cortical atrophy.

Vascular: No hyperdense vessel or unexpected calcification.

Skull: Normal. Negative for fracture or focal lesion.

Sinuses/Orbits: The visualized paranasal sinuses are essentially
clear. The mastoid air cells are unopacified.

Bilateral orbits, including the globes and retroconal soft tissues,
are within normal limits.

Other: Soft tissue swelling/ laceration overlying the lateral left
frontal bone.

CT CERVICAL SPINE FINDINGS

Alignment: Exaggerated cervical lordosis.

Skull base and vertebrae: No acute fracture. No primary bone lesion
or focal pathologic process.

Soft tissues and spinal canal: No prevertebral fluid or swelling. No
visible canal hematoma.

Disc levels:  Mild degenerative changes, most prominent at C5-6.

Upper chest: Visualized lung apices are notable for mild biapical
pleural-parenchymal scarring.

Other: Visualized thyroid is unremarkable.
IMPRESSION: Mild soft tissue swelling/ laceration overlying the lateral left
frontal bone. No evidence of calvarial fracture.

No evidence of acute intracranial abnormality. Atrophy with small
vessel ischemic changes.

No evidence of traumatic injury to the cervical spine. Mild
degenerative changes.

## 2018-01-20 DIAGNOSIS — M545 Low back pain: Secondary | ICD-10-CM | POA: Diagnosis not present

## 2018-01-20 DIAGNOSIS — M549 Dorsalgia, unspecified: Secondary | ICD-10-CM | POA: Diagnosis not present

## 2018-01-20 DIAGNOSIS — F0390 Unspecified dementia without behavioral disturbance: Secondary | ICD-10-CM | POA: Diagnosis not present

## 2018-01-20 DIAGNOSIS — M546 Pain in thoracic spine: Secondary | ICD-10-CM | POA: Diagnosis not present

## 2018-01-21 DIAGNOSIS — I509 Heart failure, unspecified: Secondary | ICD-10-CM | POA: Diagnosis not present

## 2018-01-21 DIAGNOSIS — R0602 Shortness of breath: Secondary | ICD-10-CM | POA: Diagnosis not present

## 2018-01-22 DIAGNOSIS — R41841 Cognitive communication deficit: Secondary | ICD-10-CM | POA: Diagnosis not present

## 2018-01-22 DIAGNOSIS — R1312 Dysphagia, oropharyngeal phase: Secondary | ICD-10-CM | POA: Diagnosis not present

## 2018-01-22 DIAGNOSIS — M6281 Muscle weakness (generalized): Secondary | ICD-10-CM | POA: Diagnosis not present

## 2018-01-22 DIAGNOSIS — R2689 Other abnormalities of gait and mobility: Secondary | ICD-10-CM | POA: Diagnosis not present

## 2018-01-25 DIAGNOSIS — I509 Heart failure, unspecified: Secondary | ICD-10-CM | POA: Diagnosis not present

## 2018-01-25 DIAGNOSIS — M549 Dorsalgia, unspecified: Secondary | ICD-10-CM | POA: Diagnosis not present

## 2018-02-01 DIAGNOSIS — K5900 Constipation, unspecified: Secondary | ICD-10-CM | POA: Diagnosis not present

## 2018-02-01 DIAGNOSIS — F0390 Unspecified dementia without behavioral disturbance: Secondary | ICD-10-CM | POA: Diagnosis not present

## 2018-02-01 DIAGNOSIS — R296 Repeated falls: Secondary | ICD-10-CM | POA: Diagnosis not present

## 2018-02-01 DIAGNOSIS — M549 Dorsalgia, unspecified: Secondary | ICD-10-CM | POA: Diagnosis not present

## 2018-02-09 DIAGNOSIS — S22000A Wedge compression fracture of unspecified thoracic vertebra, initial encounter for closed fracture: Secondary | ICD-10-CM | POA: Diagnosis not present

## 2018-02-09 DIAGNOSIS — N390 Urinary tract infection, site not specified: Secondary | ICD-10-CM | POA: Diagnosis not present

## 2018-02-09 DIAGNOSIS — S32000A Wedge compression fracture of unspecified lumbar vertebra, initial encounter for closed fracture: Secondary | ICD-10-CM | POA: Diagnosis not present

## 2018-03-03 DIAGNOSIS — R2689 Other abnormalities of gait and mobility: Secondary | ICD-10-CM | POA: Diagnosis not present

## 2018-03-03 DIAGNOSIS — M545 Low back pain: Secondary | ICD-10-CM | POA: Diagnosis not present

## 2018-03-03 DIAGNOSIS — F0390 Unspecified dementia without behavioral disturbance: Secondary | ICD-10-CM | POA: Diagnosis not present

## 2018-03-15 DIAGNOSIS — R05 Cough: Secondary | ICD-10-CM | POA: Diagnosis not present

## 2018-03-15 DIAGNOSIS — Z515 Encounter for palliative care: Secondary | ICD-10-CM | POA: Diagnosis not present

## 2018-03-17 DIAGNOSIS — R32 Unspecified urinary incontinence: Secondary | ICD-10-CM | POA: Diagnosis not present

## 2018-03-17 DIAGNOSIS — I4891 Unspecified atrial fibrillation: Secondary | ICD-10-CM | POA: Diagnosis not present

## 2018-03-17 DIAGNOSIS — Z9181 History of falling: Secondary | ICD-10-CM | POA: Diagnosis not present

## 2018-03-17 DIAGNOSIS — I509 Heart failure, unspecified: Secondary | ICD-10-CM | POA: Diagnosis not present

## 2018-03-19 DIAGNOSIS — M545 Low back pain: Secondary | ICD-10-CM | POA: Diagnosis not present

## 2018-03-19 DIAGNOSIS — R2689 Other abnormalities of gait and mobility: Secondary | ICD-10-CM | POA: Diagnosis not present

## 2018-03-19 DIAGNOSIS — F0390 Unspecified dementia without behavioral disturbance: Secondary | ICD-10-CM | POA: Diagnosis not present

## 2018-05-25 DIAGNOSIS — Z9181 History of falling: Secondary | ICD-10-CM | POA: Diagnosis not present

## 2018-05-25 DIAGNOSIS — D649 Anemia, unspecified: Secondary | ICD-10-CM | POA: Diagnosis not present

## 2018-05-25 DIAGNOSIS — Z515 Encounter for palliative care: Secondary | ICD-10-CM | POA: Diagnosis not present

## 2018-05-25 DIAGNOSIS — F0390 Unspecified dementia without behavioral disturbance: Secondary | ICD-10-CM | POA: Diagnosis not present

## 2018-06-01 DIAGNOSIS — F0390 Unspecified dementia without behavioral disturbance: Secondary | ICD-10-CM | POA: Diagnosis not present

## 2018-06-10 DIAGNOSIS — M545 Low back pain: Secondary | ICD-10-CM | POA: Diagnosis not present

## 2018-06-10 DIAGNOSIS — F0390 Unspecified dementia without behavioral disturbance: Secondary | ICD-10-CM | POA: Diagnosis not present

## 2018-08-04 DIAGNOSIS — I509 Heart failure, unspecified: Secondary | ICD-10-CM | POA: Diagnosis not present

## 2018-08-04 DIAGNOSIS — F0390 Unspecified dementia without behavioral disturbance: Secondary | ICD-10-CM | POA: Diagnosis not present

## 2018-08-04 DIAGNOSIS — D649 Anemia, unspecified: Secondary | ICD-10-CM | POA: Diagnosis not present

## 2018-08-04 DIAGNOSIS — I4891 Unspecified atrial fibrillation: Secondary | ICD-10-CM | POA: Diagnosis not present

## 2018-09-02 DIAGNOSIS — Z9181 History of falling: Secondary | ICD-10-CM | POA: Diagnosis not present

## 2018-09-02 DIAGNOSIS — F0390 Unspecified dementia without behavioral disturbance: Secondary | ICD-10-CM | POA: Diagnosis not present

## 2018-09-02 DIAGNOSIS — R630 Anorexia: Secondary | ICD-10-CM | POA: Diagnosis not present

## 2018-09-02 DIAGNOSIS — R296 Repeated falls: Secondary | ICD-10-CM | POA: Diagnosis not present

## 2018-09-29 DIAGNOSIS — R488 Other symbolic dysfunctions: Secondary | ICD-10-CM | POA: Diagnosis not present

## 2018-09-29 DIAGNOSIS — R1312 Dysphagia, oropharyngeal phase: Secondary | ICD-10-CM | POA: Diagnosis not present

## 2018-10-15 DIAGNOSIS — I4891 Unspecified atrial fibrillation: Secondary | ICD-10-CM | POA: Diagnosis not present

## 2018-10-15 DIAGNOSIS — Z7409 Other reduced mobility: Secondary | ICD-10-CM | POA: Diagnosis not present

## 2018-10-15 DIAGNOSIS — S0083XA Contusion of other part of head, initial encounter: Secondary | ICD-10-CM | POA: Diagnosis not present

## 2018-10-15 DIAGNOSIS — I959 Hypotension, unspecified: Secondary | ICD-10-CM | POA: Diagnosis not present

## 2018-12-30 DIAGNOSIS — R1312 Dysphagia, oropharyngeal phase: Secondary | ICD-10-CM | POA: Diagnosis not present

## 2018-12-30 DIAGNOSIS — I480 Paroxysmal atrial fibrillation: Secondary | ICD-10-CM | POA: Diagnosis not present

## 2019-02-14 DIAGNOSIS — R52 Pain, unspecified: Secondary | ICD-10-CM | POA: Diagnosis not present

## 2019-02-14 DIAGNOSIS — Z515 Encounter for palliative care: Secondary | ICD-10-CM | POA: Diagnosis not present

## 2019-02-14 DIAGNOSIS — R296 Repeated falls: Secondary | ICD-10-CM | POA: Diagnosis not present

## 2019-02-23 DEATH — deceased
